# Patient Record
Sex: Male | Born: 1950
Health system: Southern US, Community
[De-identification: ages and names within clinical notes are randomized; demographics above are authoritative.]

## PROBLEM LIST (undated history)

## (undated) DIAGNOSIS — I2510 Atherosclerotic heart disease of native coronary artery without angina pectoris: Secondary | ICD-10-CM

## (undated) DIAGNOSIS — I10 Essential (primary) hypertension: Secondary | ICD-10-CM

## (undated) DIAGNOSIS — K900 Celiac disease: Secondary | ICD-10-CM

## (undated) HISTORY — DX: Atherosclerotic heart disease of native coronary artery without angina pectoris: I25.10

## (undated) HISTORY — DX: Essential (primary) hypertension: I10

## (undated) HISTORY — DX: Celiac disease: K90.0

---

## 2007-03-09 ENCOUNTER — Encounter: Admission: RE | Admit: 2007-03-09 | Discharge: 2007-03-09 | Payer: Self-pay | Admitting: Gastroenterology

## 2007-03-16 ENCOUNTER — Ambulatory Visit (HOSPITAL_COMMUNITY): Admission: RE | Admit: 2007-03-16 | Discharge: 2007-03-16 | Payer: Self-pay | Admitting: Gastroenterology

## 2011-03-22 NOTE — Op Note (Signed)
[name], [name]                 ACCOUNT NO.:  [id number]   MEDICAL RECORD NO.:  [id number]          PATIENT TYPE:  AMB   LOCATION:  ENDO                         FACILITY:  [facility]   PHYSICIAN:  James L. Rolla Flatten., M.D.DATE OF BIRTH:  05/05/1951   DATE OF PROCEDURE:  03/16/2007  DATE OF DISCHARGE:                               OPERATIVE REPORT   PROCEDURE:  Colonoscopy.   MEDICATIONS:  Fentanyl 100 mcg, Versed 10 mg IV.   INSTRUMENT USED:  Pentax pediatric scope.   INDICATIONS:  Colon cancer screening.   DESCRIPTION OF PROCEDURE:  The procedure had been explained to the  patient and consent obtained.  In the left lateral decubitus position,  the Pentax pediatric scope was inserted following a digital exam in  advance.  The prep was excellent.  We were able to reach the cecum. The  ileocecal valve and appendiceal orifice were seen.  The scope was  withdrawn in the cecum.  The ascending, transverse, descending and  sigmoid colon were seen well.  No polyps seen.  No diverticular disease.  The rectum was examined in forward and retroflex view and was normal.  The scope was withdrawn.  The patient tolerated the procedure well.   ASSESSMENT:  Normal screening colonoscopy.   PLAN:  Routine followup.  Recheck stool Hemoccults in 5 years and repeat  colonoscopy in 10 years.           ______________________________  Joyice Faster Rolla Flatten., M.D.     [name]  D:  03/16/2007  T:  03/16/2007  Job:  [id number]   cc:   Modena Jansky. Marisue Humble, M.D.

## 2014-04-16 ENCOUNTER — Other Ambulatory Visit: Payer: Self-pay | Admitting: Gastroenterology

## 2014-04-16 DIAGNOSIS — R109 Unspecified abdominal pain: Secondary | ICD-10-CM

## 2014-04-25 ENCOUNTER — Ambulatory Visit
Admission: RE | Admit: 2014-04-25 | Discharge: 2014-04-25 | Disposition: A | Discharge status: Home / Self Care | Payer: 59 | Source: Ambulatory Visit | Attending: Gastroenterology | Admitting: Gastroenterology

## 2014-04-25 DIAGNOSIS — R109 Unspecified abdominal pain: Secondary | ICD-10-CM

## 2014-04-25 IMAGING — CT CT ENTEROGRAPHY (ABD-PELV W/ CM)
2 of 6 series · 11 of 36 positions shown, 18 images · IV contrast ([ID] VOLUMEN & [ID] OMNI 300)
Comparison: None.

CLINICAL DATA: Low abdominal pain, heme-positive stool. 6 pound
weight loss.

EXAM:
CT ABDOMEN AND PELVIS WITH CONTRAST (ENTEROGRAPHY)
TECHNIQUE: Multidetector CT of the abdomen and pelvis during bolus
administration of intravenous contrast. Negative oral contrast
VoLumen was given.
CONTRAST:  100mL OMNIPAQUE IOHEXOL 300 MG/ML  SOLN

[Series 3: enterography (id) · axial · 0.74mm/px · z∈[-365,+10]mm · 10 of 184 slices shown, 16 images]
[im 17/184  soft-tissue]
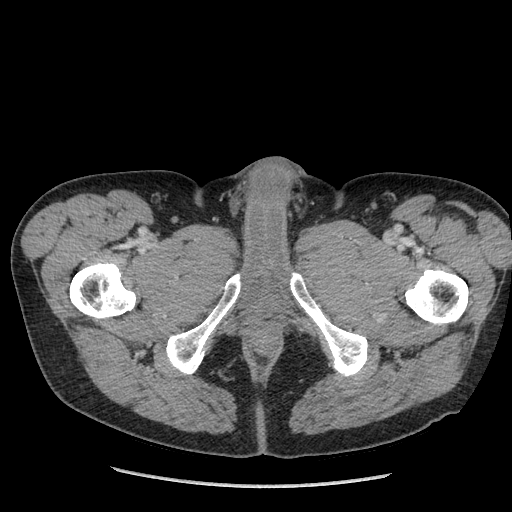
[im 17/184  bone]
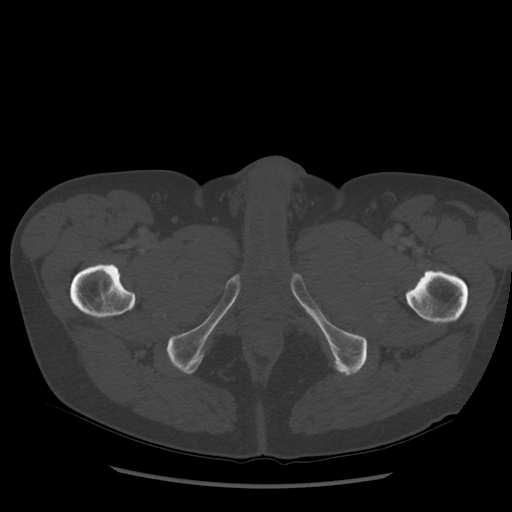
[im 34/184  soft-tissue]
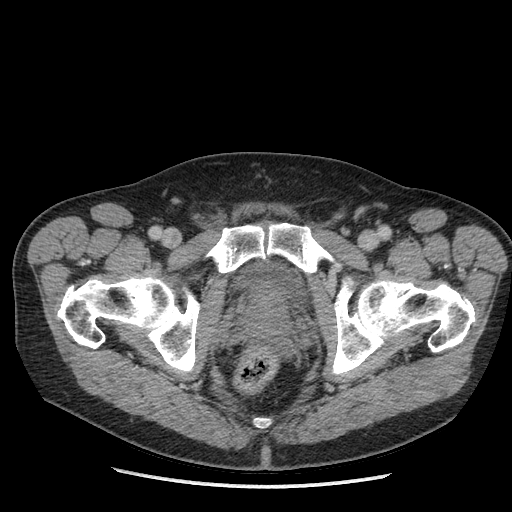
[im 50/184  soft-tissue]
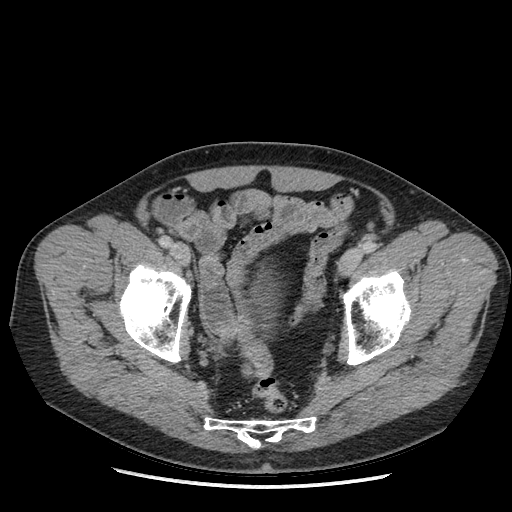
[im 67/184  soft-tissue]
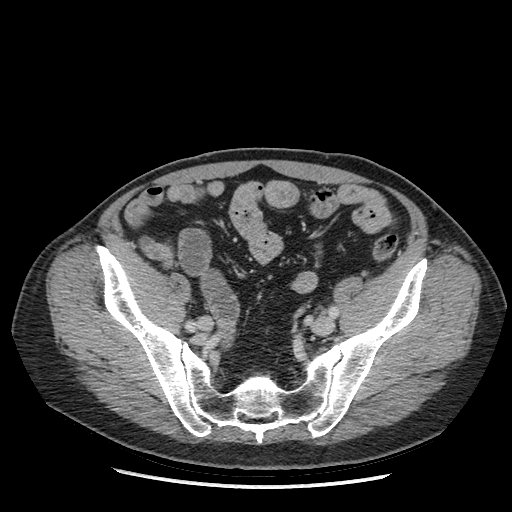
[im 84/184  soft-tissue]
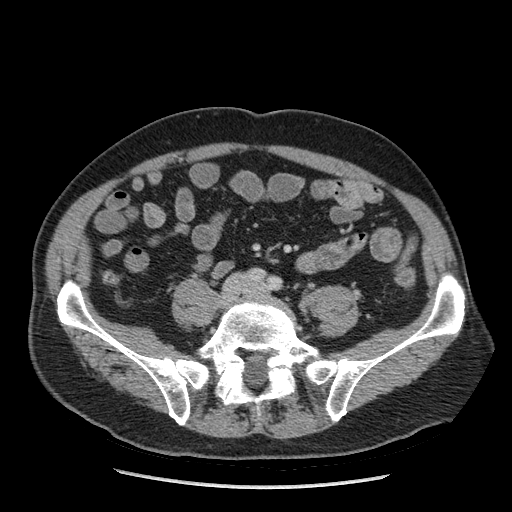
[im 100/184  soft-tissue]
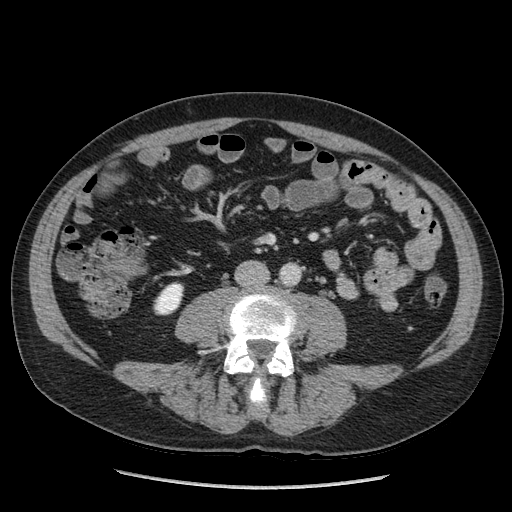
[im 117/184  soft-tissue]
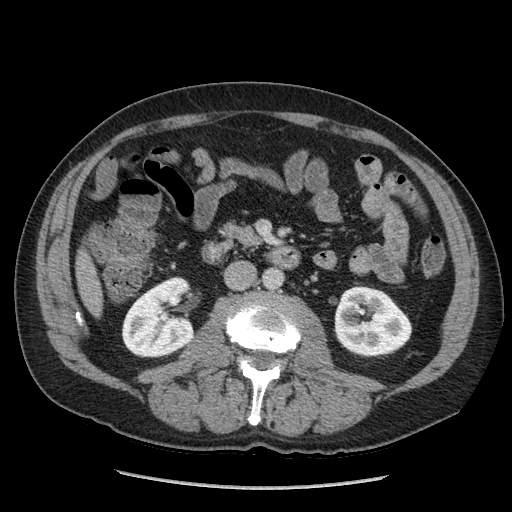
[im 117/184  lung]
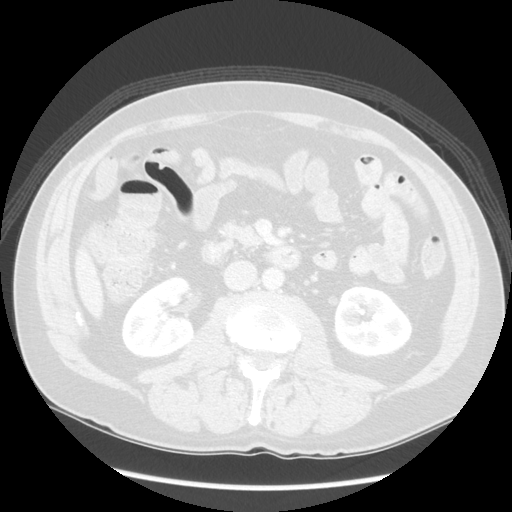
[im 134/184  soft-tissue]
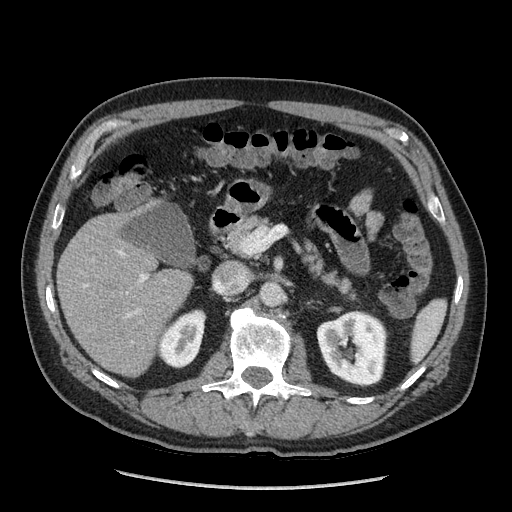
[im 134/184  lung]
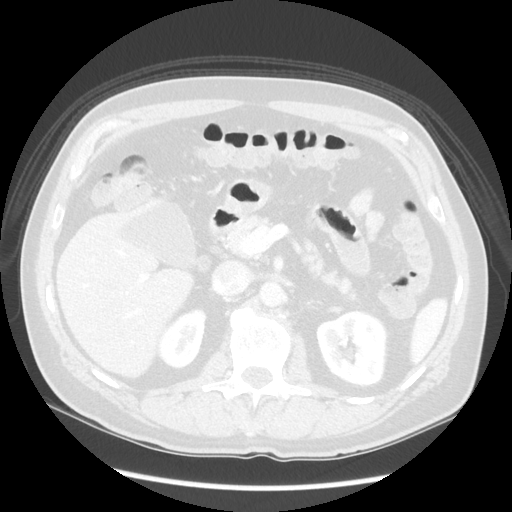
[im 150/184  soft-tissue]
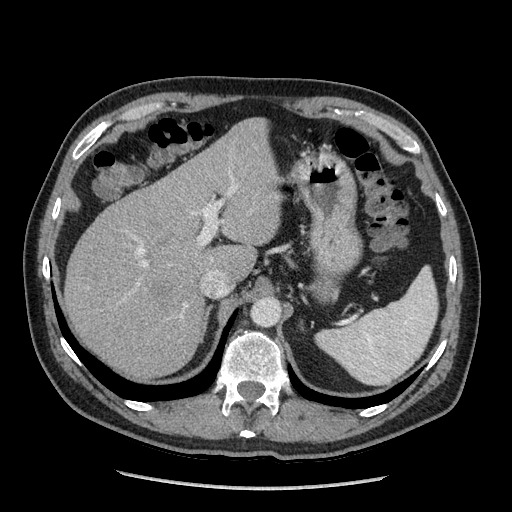
[im 150/184  lung]
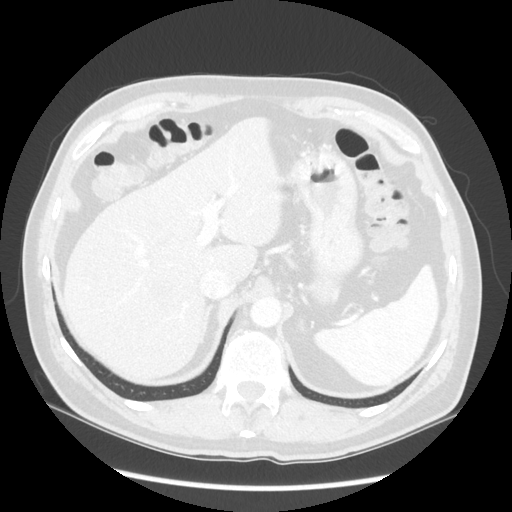
[im 150/184  bone]
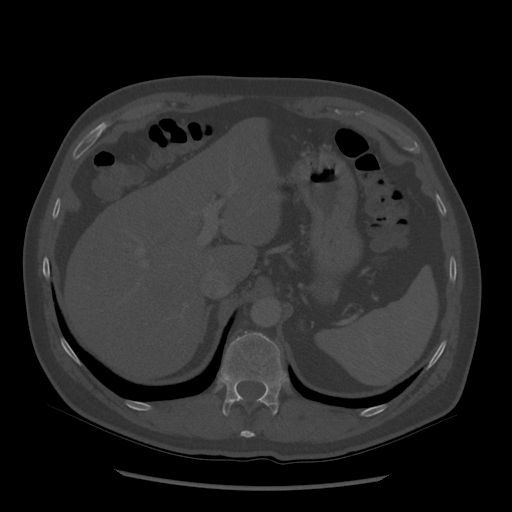
[im 167/184  soft-tissue]
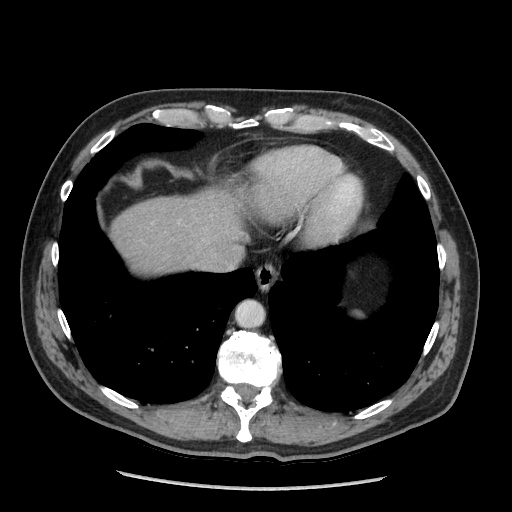
[im 167/184  lung]
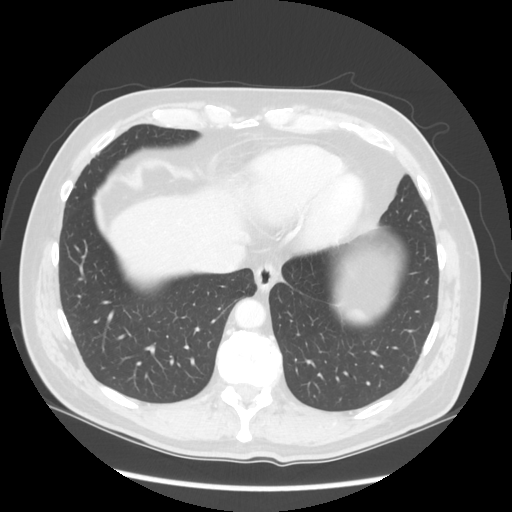

[Series 601: coronal body · coronal · 0.89mm/px · 1 of 121 slices shown, 2 images]
[im 41/121  soft-tissue]
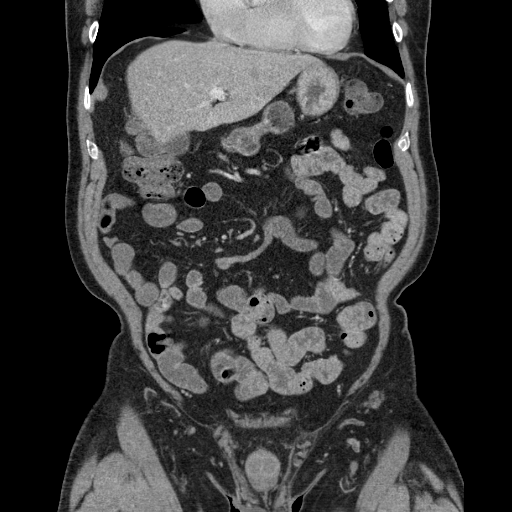
[im 41/121  bone]
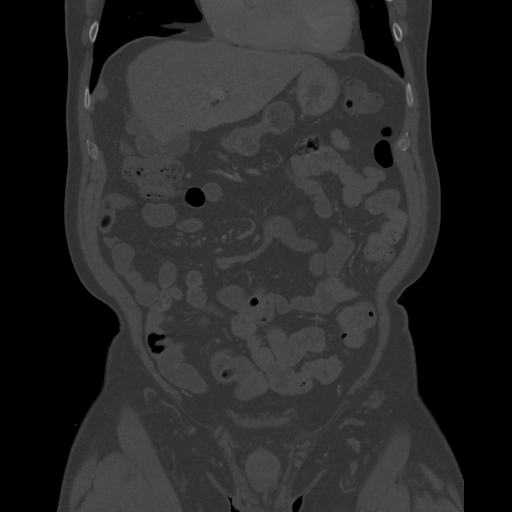

[11 of 36 positions shown; findings below may reference images not displayed]

FINDINGS: Lung bases are clear.  No pleural effusion.

Liver, gallbladder, adrenal glands, right kidney, spleen, and
pancreas are unremarkable. Mild atheromatous aortic calcification
without aneurysm. Small retroperitoneal nodes are noted measuring
less than 5 mm. No ascites. Within the left lower renal pole is a
fluid density 1.5 cm cortical lesion image 71 series 3, with
suggestion of possible thin linear internal septation or streak
artifact image 72 series 3. This is not definitely reproduced on
portal venous phase imaging, image 36 series 4.

No bowel wall thickening or focal segmental dilatation. No mural
hyper enhancement or surrounding stranding. No evidence for intra
enteric fistula. A few colonic diverticuli are incidentally noted
without evidence for diverticulitis. Normal decompressed appendix,
image 51 series 4.

No acute osseous abnormality. Bilateral L5 pars interarticularis
defects incidentally noted. Lumbar spine disc degenerative change
noted with mild leftward curvature centered at L2. Small fat
containing umbilical hernia. Approximately 30% compression deformity
of the superior endplate of L1 is identified with osteosclerosis
suggesting chronicity and no evidence for bony retropulsion.
IMPRESSION: No CT evidence for inflammatory bowel disease or other abnormality
of the hollow viscus to explain the provided history. If the patient
has not had screening colonoscopy, then standard optical colonoscopy
or virtual colonoscopy would be recommended as part of routine
surveillance and evaluation for heme-positive stools, as malignancy
could be occult at standard technique CT.

Probable left lower renal pole cortical cyst. However, on one image
series, there is a suggestion of a possible thin internal septation
and further evaluation with renal ultrasound is recommended for
better depiction of internal architecture, as this could be
artifactual at CT. This may be performed on a nonemergent outpatient
basis.

## 2014-04-25 MED ORDER — IOHEXOL 300 MG/ML  SOLN
100.0000 mL | Freq: Once | INTRAMUSCULAR | Status: AC | PRN
  Administered 2014-04-25: 100 mL via INTRAVENOUS

## 2014-07-04 ENCOUNTER — Encounter: Payer: 59 | Attending: Gastroenterology | Admitting: Dietician

## 2014-07-04 ENCOUNTER — Encounter: Payer: Self-pay | Admitting: Dietician

## 2014-07-04 VITALS — Ht 70.5 in | Wt 187.4 lb

## 2014-07-04 DIAGNOSIS — Z713 Dietary counseling and surveillance: Secondary | ICD-10-CM | POA: Diagnosis present

## 2014-07-04 DIAGNOSIS — K900 Celiac disease: Secondary | ICD-10-CM | POA: Diagnosis present

## 2014-07-04 NOTE — Progress Notes (Signed)
  Medical Nutrition Therapy:  Appt start time: 0800 end time:  0845.   Assessment:  Primary concerns today: [name] is here today with his wife for celiac disease.  He was diagnosed in June 2015 (two months ago). He has been following the gluten-free diet, finding gluten-free products appropriate for his food preferences and finding restaurant options.  He misses bread for sandwiches, hotdogs, and burgers because he does not like the gluten-free versions.  He has been eating gluten-free cereal and gluten-free pasta and snacking on popcorn as his main grain sources.  He lives with his wife and she does the food shopping and cooking.  She will still sometimes cook gluten containing foods for herself but reports washing dishes well between use and cooking his foods separately from hers when needed.  Patient states he experienced 5-10 lb weight loss before diagnosis but his weight has now stabilized.  Learning Readiness:  Change in progress  MEDICATIONS: see list  Progress Towards Goal(s):  In progress.   Nutritional Diagnosis:  NB-1.1 Food and nutrition-related knowledge deficit As related to no prior education on gluten-free diet.  As evidenced by new diagnosis of celiac disease.    Intervention:  Nutrition education on the gluten-free diet.  Described celiac disease.  Described autoimmune reaction to gluten.  Described what foods contain gluten.  Stated the importance of avoiding foods that have come into contact with gluten.  Outlined importance of using separate dishes and cooking utensils at home for gluten and non-gluten containing foods.  Described what ingredients to look for in the ingredient list that identify gluten.  Recommended appropriate grain products that naturally do not contain gluten to provide balanced diet, vitamins, and minerals.  Suggested specialty stores that carry wider range of gluten-free products.  Handouts given during visit include:  NCM Celiac Disease Nutrition  Therapy  NCM Celiac Label Reading Tips  NCM Celiac Nutrition Tips  Barriers to learning/adherence to lifestyle change: none  Demonstrated degree of understanding via:  Teach Back   Monitoring/Evaluation:  Dietary intake and body weight prn.

## 2014-08-13 DIAGNOSIS — Z23 Encounter for immunization: Secondary | ICD-10-CM | POA: Diagnosis not present

## 2014-10-20 DIAGNOSIS — I10 Essential (primary) hypertension: Secondary | ICD-10-CM | POA: Diagnosis not present

## 2014-10-20 DIAGNOSIS — K900 Celiac disease: Secondary | ICD-10-CM | POA: Diagnosis not present

## 2014-10-20 DIAGNOSIS — Z23 Encounter for immunization: Secondary | ICD-10-CM | POA: Diagnosis not present

## 2014-10-20 DIAGNOSIS — K219 Gastro-esophageal reflux disease without esophagitis: Secondary | ICD-10-CM | POA: Diagnosis not present

## 2014-10-20 DIAGNOSIS — G43909 Migraine, unspecified, not intractable, without status migrainosus: Secondary | ICD-10-CM | POA: Diagnosis not present

## 2014-10-20 DIAGNOSIS — R7301 Impaired fasting glucose: Secondary | ICD-10-CM | POA: Diagnosis not present

## 2014-11-13 DIAGNOSIS — R195 Other fecal abnormalities: Secondary | ICD-10-CM | POA: Diagnosis not present

## 2014-11-13 DIAGNOSIS — K900 Celiac disease: Secondary | ICD-10-CM | POA: Diagnosis not present

## 2014-11-19 DIAGNOSIS — L821 Other seborrheic keratosis: Secondary | ICD-10-CM | POA: Diagnosis not present

## 2014-11-19 DIAGNOSIS — Z85828 Personal history of other malignant neoplasm of skin: Secondary | ICD-10-CM | POA: Diagnosis not present

## 2014-11-25 DIAGNOSIS — K900 Celiac disease: Secondary | ICD-10-CM | POA: Diagnosis not present

## 2015-04-23 DIAGNOSIS — K900 Celiac disease: Secondary | ICD-10-CM | POA: Diagnosis not present

## 2015-04-23 DIAGNOSIS — Z0000 Encounter for general adult medical examination without abnormal findings: Secondary | ICD-10-CM | POA: Diagnosis not present

## 2015-04-23 DIAGNOSIS — K219 Gastro-esophageal reflux disease without esophagitis: Secondary | ICD-10-CM | POA: Diagnosis not present

## 2015-04-23 DIAGNOSIS — G43909 Migraine, unspecified, not intractable, without status migrainosus: Secondary | ICD-10-CM | POA: Diagnosis not present

## 2015-04-23 DIAGNOSIS — E785 Hyperlipidemia, unspecified: Secondary | ICD-10-CM | POA: Diagnosis not present

## 2015-04-23 DIAGNOSIS — Z125 Encounter for screening for malignant neoplasm of prostate: Secondary | ICD-10-CM | POA: Diagnosis not present

## 2015-04-23 DIAGNOSIS — I10 Essential (primary) hypertension: Secondary | ICD-10-CM | POA: Diagnosis not present

## 2015-08-17 DIAGNOSIS — Z23 Encounter for immunization: Secondary | ICD-10-CM | POA: Diagnosis not present

## 2015-10-26 DIAGNOSIS — K6289 Other specified diseases of anus and rectum: Secondary | ICD-10-CM | POA: Diagnosis not present

## 2015-10-26 DIAGNOSIS — K219 Gastro-esophageal reflux disease without esophagitis: Secondary | ICD-10-CM | POA: Diagnosis not present

## 2015-10-26 DIAGNOSIS — E785 Hyperlipidemia, unspecified: Secondary | ICD-10-CM | POA: Diagnosis not present

## 2015-10-26 DIAGNOSIS — G43909 Migraine, unspecified, not intractable, without status migrainosus: Secondary | ICD-10-CM | POA: Diagnosis not present

## 2015-10-26 DIAGNOSIS — K900 Celiac disease: Secondary | ICD-10-CM | POA: Diagnosis not present

## 2015-10-26 DIAGNOSIS — Z23 Encounter for immunization: Secondary | ICD-10-CM | POA: Diagnosis not present

## 2015-10-26 DIAGNOSIS — I10 Essential (primary) hypertension: Secondary | ICD-10-CM | POA: Diagnosis not present

## 2016-04-28 DIAGNOSIS — G43909 Migraine, unspecified, not intractable, without status migrainosus: Secondary | ICD-10-CM | POA: Diagnosis not present

## 2016-04-28 DIAGNOSIS — K900 Celiac disease: Secondary | ICD-10-CM | POA: Diagnosis not present

## 2016-04-28 DIAGNOSIS — I10 Essential (primary) hypertension: Secondary | ICD-10-CM | POA: Diagnosis not present

## 2016-04-28 DIAGNOSIS — E785 Hyperlipidemia, unspecified: Secondary | ICD-10-CM | POA: Diagnosis not present

## 2016-04-28 DIAGNOSIS — Z0000 Encounter for general adult medical examination without abnormal findings: Secondary | ICD-10-CM | POA: Diagnosis not present

## 2016-04-28 DIAGNOSIS — K219 Gastro-esophageal reflux disease without esophagitis: Secondary | ICD-10-CM | POA: Diagnosis not present

## 2016-07-01 ENCOUNTER — Emergency Department (HOSPITAL_COMMUNITY): Payer: PPO

## 2016-07-01 ENCOUNTER — Encounter (HOSPITAL_COMMUNITY): Payer: Self-pay

## 2016-07-01 ENCOUNTER — Encounter (HOSPITAL_COMMUNITY)
Admission: EM | Disposition: A | Discharge status: Home / Self Care | Payer: Self-pay | Source: Home / Self Care | Attending: Cardiology

## 2016-07-01 ENCOUNTER — Inpatient Hospital Stay (HOSPITAL_COMMUNITY)
Admission: EM | Admit: 2016-07-01 | Discharge: 2016-07-02 | DRG: 287 | Disposition: A | Discharge status: Home / Self Care | Payer: PPO | Attending: Cardiology | Admitting: Cardiology

## 2016-07-01 DIAGNOSIS — K900 Celiac disease: Secondary | ICD-10-CM | POA: Diagnosis present

## 2016-07-01 DIAGNOSIS — I200 Unstable angina: Secondary | ICD-10-CM | POA: Diagnosis present

## 2016-07-01 DIAGNOSIS — Z7982 Long term (current) use of aspirin: Secondary | ICD-10-CM

## 2016-07-01 DIAGNOSIS — R079 Chest pain, unspecified: Secondary | ICD-10-CM

## 2016-07-01 DIAGNOSIS — R0789 Other chest pain: Secondary | ICD-10-CM | POA: Diagnosis not present

## 2016-07-01 DIAGNOSIS — E785 Hyperlipidemia, unspecified: Secondary | ICD-10-CM | POA: Diagnosis not present

## 2016-07-01 DIAGNOSIS — R0602 Shortness of breath: Secondary | ICD-10-CM | POA: Diagnosis not present

## 2016-07-01 DIAGNOSIS — K5090 Crohn's disease, unspecified, without complications: Secondary | ICD-10-CM | POA: Diagnosis present

## 2016-07-01 DIAGNOSIS — I10 Essential (primary) hypertension: Secondary | ICD-10-CM | POA: Diagnosis present

## 2016-07-01 DIAGNOSIS — I25110 Atherosclerotic heart disease of native coronary artery with unstable angina pectoris: Principal | ICD-10-CM | POA: Diagnosis present

## 2016-07-01 HISTORY — PX: CARDIAC CATHETERIZATION: SHX172

## 2016-07-01 LAB — LIPID PANEL
Cholesterol: 166 mg/dL (ref 0–200)
HDL: 36 mg/dL — ABNORMAL LOW (ref >40)
LDL CALC: 125 mg/dL — AB (ref 0–99)
Total CHOL/HDL Ratio: 4.6 RATIO
Triglycerides: 23 mg/dL (ref <150)
VLDL: 5 mg/dL (ref 0–40)

## 2016-07-01 LAB — CBC
HEMATOCRIT: 42.2 % (ref 39.0–52.0)
HEMOGLOBIN: 14.7 g/dL (ref 13.0–17.0)
MCH: 33 pg (ref 26.0–34.0)
MCHC: 34.8 g/dL (ref 30.0–36.0)
MCV: 94.8 fL (ref 78.0–100.0)
Platelets: 182 10*3/uL (ref 150–400)
RBC: 4.45 MIL/uL (ref 4.22–5.81)
RDW: 12.3 % (ref 11.5–15.5)
WBC: 6.7 10*3/uL (ref 4.0–10.5)

## 2016-07-01 LAB — TROPONIN I
TROPONIN I: 0.11 ng/mL — AB (ref <0.03)
Troponin I: 0.12 ng/mL (ref <0.03)

## 2016-07-01 LAB — BASIC METABOLIC PANEL
ANION GAP: 8 (ref 5–15)
BUN: 14 mg/dL (ref 6–20)
CHLORIDE: 102 mmol/L (ref 101–111)
CO2: 27 mmol/L (ref 22–32)
Calcium: 9.4 mg/dL (ref 8.9–10.3)
Creatinine, Ser: 1.15 mg/dL (ref 0.61–1.24)
Glucose, Bld: 142 mg/dL — ABNORMAL HIGH (ref 65–99)
POTASSIUM: 4.3 mmol/L (ref 3.5–5.1)
SODIUM: 137 mmol/L (ref 135–145)

## 2016-07-01 LAB — I-STAT TROPONIN, ED: Troponin i, poc: 0 ng/mL (ref 0.00–0.08)

## 2016-07-01 LAB — POCT ACTIVATED CLOTTING TIME: ACTIVATED CLOTTING TIME: 197 s

## 2016-07-01 SURGERY — LEFT HEART CATH AND CORONARY ANGIOGRAPHY

## 2016-07-01 MED ORDER — ASPIRIN EC 81 MG PO TBEC
81.0000 mg | DELAYED_RELEASE_TABLET | Freq: Every day | ORAL | Status: DC
  Administered 2016-07-02: 81 mg via ORAL
  Filled 2016-07-01: qty 1

## 2016-07-01 MED ORDER — MIDAZOLAM HCL 2 MG/2ML IJ SOLN
INTRAMUSCULAR | Status: AC
  Filled 2016-07-01: qty 2

## 2016-07-01 MED ORDER — HEPARIN (PORCINE) IN NACL 2-0.9 UNIT/ML-% IJ SOLN
INTRAMUSCULAR | Status: AC
  Filled 2016-07-01: qty 1000

## 2016-07-01 MED ORDER — NITROGLYCERIN 0.4 MG SL SUBL
0.4000 mg | SUBLINGUAL_TABLET | SUBLINGUAL | Status: DC | PRN
  Administered 2016-07-01 (×2): 0.4 mg via SUBLINGUAL
  Filled 2016-07-01: qty 1

## 2016-07-01 MED ORDER — CARVEDILOL 6.25 MG PO TABS
6.2500 mg | ORAL_TABLET | Freq: Two times a day (BID) | ORAL | Status: DC
Start: 2016-07-01 — End: 2016-07-02
  Administered 2016-07-01 – 2016-07-02 (×2): 6.25 mg via ORAL
  Filled 2016-07-01 (×2): qty 1

## 2016-07-01 MED ORDER — ACETAMINOPHEN 325 MG PO TABS: 650.0000 mg | ORAL_TABLET | ORAL | Status: DC | PRN

## 2016-07-01 MED ORDER — SODIUM CHLORIDE 0.9% FLUSH: 3.0000 mL | Freq: Two times a day (BID) | INTRAVENOUS | Status: DC

## 2016-07-01 MED ORDER — IOPAMIDOL (ISOVUE-370) INJECTION 76%
INTRAVENOUS | Status: AC
  Filled 2016-07-01: qty 100

## 2016-07-01 MED ORDER — SODIUM CHLORIDE 0.9% FLUSH: 3.0000 mL | INTRAVENOUS | Status: DC | PRN

## 2016-07-01 MED ORDER — HEPARIN SODIUM (PORCINE) 1000 UNIT/ML IJ SOLN
INTRAMUSCULAR | Status: AC
  Filled 2016-07-01: qty 1

## 2016-07-01 MED ORDER — ASPIRIN 81 MG PO TABS: 81.0000 mg | ORAL_TABLET | Freq: Every day | ORAL | Status: DC

## 2016-07-01 MED ORDER — HYDROMORPHONE HCL 1 MG/ML IJ SOLN
INTRAMUSCULAR | Status: DC | PRN
  Administered 2016-07-01 (×2): 0.5 mg via INTRAVENOUS

## 2016-07-01 MED ORDER — LIDOCAINE HCL (PF) 1 % IJ SOLN
INTRAMUSCULAR | Status: AC
  Filled 2016-07-01: qty 30

## 2016-07-01 MED ORDER — ATORVASTATIN CALCIUM 80 MG PO TABS: 80.0000 mg | ORAL_TABLET | Freq: Every day | ORAL | Status: DC

## 2016-07-01 MED ORDER — HEPARIN (PORCINE) IN NACL 2-0.9 UNIT/ML-% IJ SOLN
INTRAMUSCULAR | Status: DC | PRN
  Administered 2016-07-01: 1000 mL

## 2016-07-01 MED ORDER — VERAPAMIL HCL 2.5 MG/ML IV SOLN
INTRA_ARTERIAL | Status: DC | PRN
  Administered 2016-07-01 (×2): 10 mL via INTRA_ARTERIAL

## 2016-07-01 MED ORDER — SUMATRIPTAN SUCCINATE 100 MG PO TABS
100.0000 mg | ORAL_TABLET | ORAL | Status: DC | PRN
  Filled 2016-07-01: qty 1

## 2016-07-01 MED ORDER — CARVEDILOL 3.125 MG PO TABS: 3.1250 mg | ORAL_TABLET | Freq: Two times a day (BID) | ORAL | Status: DC

## 2016-07-01 MED ORDER — HEPARIN SODIUM (PORCINE) 1000 UNIT/ML IJ SOLN
INTRAMUSCULAR | Status: DC | PRN
  Administered 2016-07-01: 6000 [IU] via INTRAVENOUS
  Administered 2016-07-01: 3000 [IU] via INTRAVENOUS

## 2016-07-01 MED ORDER — ATORVASTATIN CALCIUM 80 MG PO TABS
80.0000 mg | ORAL_TABLET | Freq: Every day | ORAL | Status: DC
  Administered 2016-07-01: 80 mg via ORAL

## 2016-07-01 MED ORDER — ASPIRIN 81 MG PO CHEW
324.0000 mg | CHEWABLE_TABLET | Freq: Once | ORAL | Status: AC
  Administered 2016-07-01: 324 mg via ORAL
  Filled 2016-07-01: qty 4

## 2016-07-01 MED ORDER — LIDOCAINE HCL (PF) 1 % IJ SOLN
INTRAMUSCULAR | Status: DC | PRN
  Administered 2016-07-01: 2 mL via INTRADERMAL

## 2016-07-01 MED ORDER — IOPAMIDOL (ISOVUE-370) INJECTION 76%
INTRAVENOUS | Status: DC | PRN
  Administered 2016-07-01: 60 mL via INTRA_ARTERIAL

## 2016-07-01 MED ORDER — PANTOPRAZOLE SODIUM 40 MG PO TBEC: 40.0000 mg | DELAYED_RELEASE_TABLET | Freq: Every day | ORAL | Status: DC

## 2016-07-01 MED ORDER — HYDROMORPHONE HCL 1 MG/ML IJ SOLN
INTRAMUSCULAR | Status: AC
  Filled 2016-07-01: qty 1

## 2016-07-01 MED ORDER — PANTOPRAZOLE SODIUM 40 MG PO TBEC
40.0000 mg | DELAYED_RELEASE_TABLET | Freq: Every day | ORAL | Status: DC
  Administered 2016-07-02: 40 mg via ORAL
  Filled 2016-07-01: qty 1

## 2016-07-01 MED ORDER — CARVEDILOL 6.25 MG PO TABS: 6.2500 mg | ORAL_TABLET | Freq: Two times a day (BID) | ORAL | Status: DC

## 2016-07-01 MED ORDER — VERAPAMIL HCL 2.5 MG/ML IV SOLN
INTRAVENOUS | Status: AC
  Filled 2016-07-01: qty 2

## 2016-07-01 MED ORDER — MIDAZOLAM HCL 2 MG/2ML IJ SOLN
INTRAMUSCULAR | Status: DC | PRN
  Administered 2016-07-01: 1 mg via INTRAVENOUS
  Administered 2016-07-01: 2 mg via INTRAVENOUS

## 2016-07-01 MED ORDER — LISINOPRIL 10 MG PO TABS
10.0000 mg | ORAL_TABLET | Freq: Every day | ORAL | Status: DC
  Administered 2016-07-02: 10 mg via ORAL
  Filled 2016-07-01: qty 1

## 2016-07-01 MED ORDER — ONDANSETRON HCL 4 MG/2ML IJ SOLN: 4.0000 mg | Freq: Four times a day (QID) | INTRAMUSCULAR | Status: DC | PRN

## 2016-07-01 MED ORDER — SODIUM CHLORIDE 0.9 % WEIGHT BASED INFUSION: 1.0000 mL/kg/h | INTRAVENOUS | Status: DC

## 2016-07-01 MED ORDER — SODIUM CHLORIDE 0.9 % WEIGHT BASED INFUSION: 3.0000 mL/kg/h | INTRAVENOUS | Status: DC

## 2016-07-01 MED ORDER — ATORVASTATIN CALCIUM 80 MG PO TABS
80.0000 mg | ORAL_TABLET | Freq: Every day | ORAL | Status: DC
  Filled 2016-07-01: qty 1

## 2016-07-01 MED ORDER — NITROGLYCERIN 1 MG/10 ML FOR IR/CATH LAB
INTRA_ARTERIAL | Status: AC
  Filled 2016-07-01: qty 10

## 2016-07-01 MED ORDER — SODIUM CHLORIDE 0.9 % IV SOLN: 250.0000 mL | INTRAVENOUS | Status: DC | PRN

## 2016-07-01 SURGICAL SUPPLY — 12 items
CATH IMPULSE 5F ANG/FL3.5 (CATHETERS) ×3 IMPLANT
CATH OPTITORQUE TIG 4.0 5F (CATHETERS) ×3 IMPLANT
CATH VISTA GUIDE 6FR AR1 (CATHETERS) ×3 IMPLANT
DEVICE RAD COMP TR BAND LRG (VASCULAR PRODUCTS) ×3 IMPLANT
GLIDESHEATH SLEND A-KIT 6F 20G (SHEATH) ×3 IMPLANT
KIT ENCORE 26 ADVANTAGE (KITS) ×3 IMPLANT
KIT HEART LEFT (KITS) ×3 IMPLANT
PACK CARDIAC CATHETERIZATION (CUSTOM PROCEDURE TRAY) ×3 IMPLANT
TRANSDUCER W/STOPCOCK (MISCELLANEOUS) ×3 IMPLANT
TUBING CIL FLEX 10 FLL-RA (TUBING) ×3 IMPLANT
WIRE COUGAR XT STRL 190CM (WIRE) ×3 IMPLANT
WIRE SAFE-T 1.5MM-J .035X260CM (WIRE) ×3 IMPLANT

## 2016-07-01 NOTE — Progress Notes (Signed)
Notified by CCMD of new-onset Mobitz I (Wenkebach) AV block.  Patient is asymptomatic at this time. Will obtain 12-lead EKG to confirm and notify MD for any symptoms overnight.  Continuing to monitor.

## 2016-07-01 NOTE — ED Notes (Signed)
Chest pain 2-3/10.

## 2016-07-01 NOTE — H&P (Signed)
[name] is an 65 y.o. male.   Chief Complaint: Chest pain HPI: [name]  is a 65 y.o. male with a history of HTN and Chron's. He developed midsternal chest pain on the evening of 06/30/2016 while sitting on the couch. Pain persisted throughout the night and was still present this morning prompting presentation the ED. He was given Sl Ntg and ASA and pain has since resolved. He also reports a similar episode several days, also at rest, however resolved spontaneously. He states he has been taking care of his parents' house and has been doing a lot of yard this week. Symptoms seemed to improve when he would get up and walk around last night. No SOB, PND, orthopnea, edema, or symptoms suggestive of TIA or claudication. No history of DM, HLD, thyroid disease, tobacco use, or family history of premature CAD.  Past Medical History:  Diagnosis Date  . Celiac disease   . Hypertension     History reviewed. No pertinent surgical history.  No family history on file. Social History:  reports that he has never smoked. He has never used smokeless tobacco. He reports that he drinks alcohol. He reports that he does not use drugs.  Allergies:  Allergies  Allergen Reactions  . Advil [Ibuprofen] Other (See Comments)    Unknown    Review of Systems - History obtained from the patient General ROS: negative for - fatigue, fever, malaise, weight gain or weight loss Respiratory ROS: negative for - cough, orthopnea or shortness of breath Cardiovascular ROS: positive for - chest pain negative for - dyspnea on exertion, edema, palpitations or paroxysmal nocturnal dyspnea Gastrointestinal ROS: no abdominal pain, change in bowel habits, or black or bloody stools Musculoskeletal ROS: negative for - joint pain, joint swelling or muscle pain Neurological ROS: no TIA or stroke symptoms  Blood pressure 124/71, pulse 77, temperature 98.4 F (36.9 C), temperature source Oral, resp. rate 13, height _0  (1.778 m),  weight 86.2 kg (190 lb), SpO2 98 %.   General appearance: alert, cooperative, appears stated age and no distress Eyes: negative Neck: no adenopathy, no carotid bruit, no JVD, supple, symmetrical, trachea midline and thyroid not enlarged, symmetric, no tenderness/mass/nodules Resp: clear to auscultation bilaterally Chest wall: no tenderness Cardio: S1, S2 normal and I/VI SEM at RUSB and apex GI: soft, non-tender; bowel sounds normal; no masses,  no organomegaly Extremities: extremities normal, atraumatic, no cyanosis or edema Pulses: 2+ and symmetric Skin: Skin color, texture, turgor normal. No rashes or lesions Neurologic: Grossly normal  Results for orders placed or performed during the hospital encounter of 07/01/16 (from the past 48 hour(s))  Basic metabolic panel     Status: Abnormal   Collection Time: 07/01/16 11:13 AM  Result Value Ref Range   Sodium 137 135 - 145 mmol/L   Potassium 4.3 3.5 - 5.1 mmol/L   Chloride 102 101 - 111 mmol/L   CO2 27 22 - 32 mmol/L   Glucose, Bld 142 (H) 65 - 99 mg/dL   BUN 14 6 - 20 mg/dL   Creatinine, Ser 1.15 0.61 - 1.24 mg/dL   Calcium 9.4 8.9 - 10.3 mg/dL   GFR calc non Af Amer >60 >60 mL/min   GFR calc Af Amer >60 >60 mL/min    Comment: (NOTE) The eGFR has been calculated using the CKD EPI equation. This calculation has not been validated in all clinical situations. eGFR's persistently <60 mL/min signify possible Chronic Kidney Disease.    Anion gap 8 5 -  15  CBC     Status: None   Collection Time: 07/01/16 11:13 AM  Result Value Ref Range   WBC 6.7 4.0 - 10.5 K/uL   RBC 4.45 4.22 - 5.81 MIL/uL   Hemoglobin 14.7 13.0 - 17.0 g/dL   HCT 42.2 39.0 - 52.0 %   MCV 94.8 78.0 - 100.0 fL   MCH 33.0 26.0 - 34.0 pg   MCHC 34.8 30.0 - 36.0 g/dL   RDW 12.3 11.5 - 15.5 %   Platelets 182 150 - 400 K/uL  I-stat troponin, ED     Status: None   Collection Time: 07/01/16 11:39 AM  Result Value Ref Range   Troponin i, poc 0.00 0.00 - 0.08 ng/mL    Comment 3            Comment: Due to the release kinetics of cTnI, a negative result within the first hours of the onset of symptoms does not rule out myocardial infarction with certainty. If myocardial infarction is still suspected, repeat the test at appropriate intervals.    Dg Chest 2 View  Result Date: 07/01/2016 CLINICAL DATA:  Intermittent chest tightness and discomfort for 3 days EXAM: CHEST  2 VIEW COMPARISON:  None. FINDINGS: The heart size and mediastinal contours are within normal limits. Both lungs are clear. The visualized skeletal structures are unremarkable. IMPRESSION: No active cardiopulmonary disease. Electronically Signed   By: Kerby Moors M.D.   On: 07/01/2016 12:01    Labs:   Lab Results  Component Value Date   WBC 6.7 07/01/2016   HGB 14.7 07/01/2016   HCT 42.2 07/01/2016   MCV 94.8 07/01/2016   PLT 182 07/01/2016    Recent Labs Lab 07/01/16 1113  NA 137  K 4.3  CL 102  CO2 27  BUN 14  CREATININE 1.15  CALCIUM 9.4  GLUCOSE 142*    Lipid Panel  No results found for: CHOL, TRIG, HDL, CHOLHDL, VLDL, LDLCALC  BNP (last 3 results) No results for input(s): BNP in the last 8760 hours.  HEMOGLOBIN A1C No results found for: HGBA1C, MPG  Cardiac Panel (last 3 results) No results for input(s): CKTOTAL, CKMB, TROPONINI, RELINDX in the last 8760 hours.  No results found for: CKTOTAL, CKMB, CKMBINDEX, TROPONINI   TSH No results for input(s): TSH in the last 8760 hours.  EKG 07/01/2016: sinus rhythm with 1st degree AV block at a rate of 70bpm, normal axis, no evidence of ischemia.   (Not in a hospital admission)    Current Facility-Administered Medications:  .  nitroGLYCERIN (NITROSTAT) SL tablet 0.4 mg, 0.4 mg, Sublingual, Q5 min PRN, Julianne Rice, MD, 0.4 mg at 07/01/16 1306  Current Outpatient Prescriptions:  .  aspirin 81 MG tablet, Take 81 mg by mouth daily., Disp: , Rfl:  .  Fish Oil-Cholecalciferol (FISH OIL + D3 PO), Take 1  capsule by mouth at bedtime. , Disp: , Rfl:  .  lisinopril (PRINIVIL,ZESTRIL) 10 MG tablet, Take 10 mg by mouth daily., Disp: , Rfl:  .  omeprazole (PRILOSEC) 20 MG capsule, Take 20 mg by mouth every other day., Disp: , Rfl:  .  SUMAtriptan (IMITREX) 100 MG tablet, Take 100 mg by mouth every 2 (two) hours as needed for migraine or headache. May repeat in 2 hours if headache persists or recurs., Disp: , Rfl:     Assessment/Plan 1. Atypical chest pain 2. Essential Hypertension 3. Celiac Disease  Recommendation: Symptoms atypical, however, relieved with nitroglycerin. No ischemic changes on EKG  and troponin negative despite ongoing pain for 12+ hours. Would recommend stress testing to exclude ischemic etiology. Will also repeat troponin, if positive, will then need coronary angiogram. Dr. Einar Gip to see with further recommendations.  Rachel Bo, NP-C 07/01/2016, 3:08 PM [facility] Pager: [phone] Office: [phone]

## 2016-07-01 NOTE — ED Notes (Signed)
Pt states chest pain 1/10.

## 2016-07-01 NOTE — ED Provider Notes (Signed)
[facility] DEPT Provider Note   CSN: [id number] Arrival date & time: 07/01/16  1048     History   Chief Complaint Chief Complaint  Patient presents with  . Chest Pain    HPI [name] is a 65 y.o. male.  HPI Patient presents with 3 days of episodic chest tightness. No nausea, vomiting, shortness of breath. No lower extremity swelling or pain. Patient states that he began having tightness yesterday evening. This persisted through the night. No radiation. Rates his pain currently 5/10. No history of coronary artery disease. Father had MI in his 36s. No fever or chills. Patient states he's been under increased stress recently. Past Medical History:  Diagnosis Date  . Celiac disease   . Hypertension     There are no active problems to display for this patient.   History reviewed. No pertinent surgical history.     Home Medications    Prior to Admission medications   Medication Sig Start Date End Date Taking? Authorizing Provider  aspirin 81 MG tablet Take 81 mg by mouth daily.    Historical Provider, MD  Fish Oil-Cholecalciferol (FISH OIL + D3 PO) Take by mouth.    Historical Provider, MD  lisinopril (PRINIVIL,ZESTRIL) 10 MG tablet Take 10 mg by mouth daily.    Historical Provider, MD  Multiple Vitamin (MULTIVITAMIN) tablet Take 1 tablet by mouth daily.    Historical Provider, MD  OMEPRAZOLE PO Take by mouth.    Historical Provider, MD  SUMAtriptan (IMITREX) 100 MG tablet Take 100 mg by mouth every 2 (two) hours as needed for migraine or headache. May repeat in 2 hours if headache persists or recurs.    Historical Provider, MD  vitamin C (ASCORBIC ACID) 500 MG tablet Take 500 mg by mouth daily.    Historical Provider, MD    Family History No family history on file.  Social History Social History  Substance Use Topics  . Smoking status: Never Smoker  . Smokeless tobacco: Never Used  . Alcohol use Yes     Allergies   Advil [ibuprofen]   Review of  Systems Review of Systems  Constitutional: Negative for chills and fever.  Respiratory: Positive for chest tightness and shortness of breath. Negative for cough.   Cardiovascular: Positive for chest pain. Negative for palpitations and leg swelling.  Gastrointestinal: Negative for abdominal pain, constipation, diarrhea, nausea and vomiting.  Musculoskeletal: Negative for back pain, myalgias, neck pain and neck stiffness.  Skin: Negative for rash and wound.  Neurological: Negative for dizziness, weakness, light-headedness, numbness and headaches.  All other systems reviewed and are negative.    Physical Exam Updated Vital Signs BP 155/84   Pulse 77   Temp 98.4 F (36.9 C) (Oral)   Resp 17   Ht 5' 10"  (1.778 m)   Wt 190 lb (86.2 kg)   SpO2 98%   BMI 27.26 kg/m   Physical Exam  Constitutional: He is oriented to person, place, and time. He appears well-developed and well-nourished.  HENT:  Head: Normocephalic and atraumatic.  Mouth/Throat: Oropharynx is clear and moist.  Eyes: EOM are normal. Pupils are equal, round, and reactive to light.  Neck: Normal range of motion. Neck supple. No JVD present.  Cardiovascular: Normal rate and regular rhythm.  Exam reveals no gallop and no friction rub.   Murmur heard. Pulmonary/Chest: Effort normal and breath sounds normal. No respiratory distress. He has no wheezes. He has no rales. He exhibits no tenderness.  Abdominal: Soft. Bowel sounds are  normal. There is no tenderness. There is no rebound and no guarding.  Musculoskeletal: Normal range of motion. He exhibits no edema or tenderness.  No lower extremity swelling, tenderness or asymmetry. Distal pulses are 2+.  Neurological: He is alert and oriented to person, place, and time.  Moves all extremities without deficit. Sensation is fully intact.  Skin: Skin is warm and dry. Capillary refill takes less than 2 seconds. No rash noted. No erythema.  Psychiatric: He has a normal mood and affect.  His behavior is normal.  Nursing note and vitals reviewed.    ED Treatments / Results  Labs (all labs ordered are listed, but only abnormal results are displayed) Labs Reviewed  BASIC METABOLIC PANEL - Abnormal; Notable for the following:       Result Value   Glucose, Bld 142 (*)    All other components within normal limits  CBC  I-STAT TROPOININ, ED    EKG  EKG Interpretation  Date/Time:  Friday July 01 2016 10:53:11 EDT Ventricular Rate:  92 PR Interval:  192 QRS Duration: 72 QT Interval:  340 QTC Calculation: 420 R Axis:   78 Text Interpretation:  Normal sinus rhythm Normal ECG Confirmed by Lita Mains  MD, Matei Magnone ([id number]) on 07/01/2016 12:26:11 PM       Radiology Dg Chest 2 View  Result Date: 07/01/2016 CLINICAL DATA:  Intermittent chest tightness and discomfort for 3 days EXAM: CHEST  2 VIEW COMPARISON:  None. FINDINGS: The heart size and mediastinal contours are within normal limits. Both lungs are clear. The visualized skeletal structures are unremarkable. IMPRESSION: No active cardiopulmonary disease. Electronically Signed   By: Kerby Moors M.D.   On: 07/01/2016 12:01    Procedures Procedures (including critical care time)  Medications Ordered in ED Medications  nitroGLYCERIN (NITROSTAT) SL tablet 0.4 mg (not administered)  aspirin chewable tablet 324 mg (not administered)     Initial Impression / Assessment and Plan / ED Course  I have reviewed the triage vital signs and the nursing notes.  Pertinent labs & imaging results that were available during my care of the patient were reviewed by me and considered in my medical decision making (see chart for details).  Clinical Course  Will give aspirin and nitroglycerin and reassess pain.  Patient is currently chest pain-free. Discussed with Dr. Einar Gip and will evaluate in the emergency department.  Final Clinical Impressions(s) / ED Diagnoses   Final diagnoses:  None    New Prescriptions New  Prescriptions   No medications on file     Julianne Rice, MD 07/01/16 1601

## 2016-07-01 NOTE — ED Triage Notes (Signed)
Per Pt, Pt is coming from home with a complaint of intermittent chest pain for the past three days. Reports slight SOB with exertion. Denies any dizziness, N/V.

## 2016-07-01 NOTE — Interval H&P Note (Signed)
History and Physical Interval Note:  07/01/2016 6:07 PM  [name]  has presented today for surgery, with the diagnosis of cp  The various methods of treatment have been discussed with the patient and family. After consideration of risks, benefits and other options for treatment, the patient has consented to  Procedure(s): Left Heart Cath and Coronary Angiography (N/A) and possible PCI as a surgical intervention .  The patient's history has been reviewed, patient examined, no change in status, stable for surgery.  I have reviewed the patient's chart and labs.  Questions were answered to the patient's satisfaction.   Cath Lab Visit (complete for each Cath Lab visit)  Clinical Evaluation Leading to the Procedure:   ACS: Yes.    Non-ACS:    Anginal Classification: CCS IV  Anti-ischemic medical therapy: No Therapy  Non-Invasive Test Results: No non-invasive testing performed  Prior CABG: No previous CABG        Adrian Prows

## 2016-07-01 NOTE — Plan of Care (Signed)
Problem: Consults Goal: Cardiac Cath Patient Education (See Patient Education module for education specifics.) Outcome: Completed/Met Date Met: 07/01/16 TR band removed and gauze with tegaderm dressing applied to site. No evidence of subcutaneous bleeding or other site problems. No pain reported.  Patient and family educated on post-cath site care, dressing care and removal, activity restrictions, bathroom priveleges until AM, potential site complications, signs and symptoms of infection, and when to call MD. No questions at this time; both patient and spouse verbalized understanding.  Continuing to monitor.  Problem: Phase I Progression Outcomes Goal: Initial discharge plan identified Outcome: Completed/Met Date Met: 07/01/16 Plan medical management per MD (up-titration of carvedilol from 3.125 mg BID to 6.25 mg BID) with likely discharge in AM 8/26. Goal: Post Cath/PCI return to appropriate Path Outcome: Progressing Will return patient to general pathway per protocol.

## 2016-07-02 LAB — TROPONIN I: Troponin I: 0.15 ng/mL (ref <0.03)

## 2016-07-02 MED ORDER — NITROGLYCERIN 0.4 MG SL SUBL: 0.4000 mg | SUBLINGUAL_TABLET | SUBLINGUAL | 1 refills | Status: AC | PRN

## 2016-07-02 MED ORDER — CARVEDILOL 6.25 MG PO TABS: 6.2500 mg | ORAL_TABLET | Freq: Two times a day (BID) | ORAL | 1 refills | Status: DC

## 2016-07-02 MED ORDER — ATORVASTATIN CALCIUM 80 MG PO TABS: 80.0000 mg | ORAL_TABLET | Freq: Every day | ORAL | 1 refills | Status: DC

## 2016-07-02 NOTE — Discharge Summary (Signed)
Physician Discharge Summary  Patient ID: [name] MRN: [id number] DOB/AGE: [birth year] 65 y.o.  Admit date: 07/01/2016 Discharge date: 07/02/2016  Discharge Diagnoses: 1. Mild, non obstructive CAD 2. Hyperlipidemia 3. Essential Hypertension 4. Celiac Disease  Significant Diagnostic Studies: Coronary Angiogram 07/01/2016:  Prox RCA lesion, 20 %stenosed.  Mid RCA to Dist RCA lesion, 20 %stenosed.  Prox LAD to Mid LAD lesion, 30 %stenosed.  Hospital Course: Ann Groeneveld  is a 65 y.o. male with a history of HTN and Chron's. He developed midsternal chest pain on the evening of 06/30/2016 while sitting on the couch. Pain persisted throughout the night and was still present this morning prompting presentation the ED. He was given Sl Ntg and ASA and pain has since resolved. He also reports a similar episode several days, also at rest, however resolved spontaneously. He states he has been taking care of his parents' house and has been doing a lot of yard this week. Symptoms seemed to improve when he would get up and walk around last night. No SOB, PND, orthopnea, edema, or symptoms suggestive of TIA or claudication. No history of DM, HLD, thyroid disease, tobacco use, or family history of premature CAD. Troponin minimally elevated, therefore was scheduled for coronary angiogram which revealed mild coronary artery disease. Medical therapy recommended. Pain free since cath.   Recommendations on discharge: Will start beta blocker and atorvastatin. Discussed aggressive medical therapy and lifestyle modification. Follow up outpatient for reevaluation.   Discharge Exam: Blood pressure 111/63, pulse 72, temperature 98.8 F (37.1 C), temperature source Oral, resp. rate 16, height 5' 10"  (1.778 m), weight 86 kg (189 lb 9.6 oz), SpO2 97 %.    General appearance: alert, cooperative, appears stated age and no distress Eyes: negative Neck: no adenopathy, no carotid bruit, no JVD, supple, symmetrical, trachea  midline and thyroid not enlarged, symmetric, no tenderness/mass/nodules Resp: clear to auscultation bilaterally Chest wall: no tenderness Cardio: S1, S2 normal and I/VI SEM at RUSB and apex GI: soft, non-tender; bowel sounds normal; no masses,  no organomegaly Extremities: extremities normal, atraumatic, no cyanosis or edema Pulses: 2+ and symmetric, right radial access site asymptomatic Skin: Skin color, texture, turgor normal. No rashes or lesions Neurologic: Grossly normal  Labs:   Lab Results  Component Value Date   WBC 6.7 07/01/2016   HGB 14.7 07/01/2016   HCT 42.2 07/01/2016   MCV 94.8 07/01/2016   PLT 182 07/01/2016    Recent Labs Lab 07/01/16 1113  NA 137  K 4.3  CL 102  CO2 27  BUN 14  CREATININE 1.15  CALCIUM 9.4  GLUCOSE 142*    Lipid Panel     Component Value Date/Time   CHOL 166 07/01/2016 1932   TRIG 23 07/01/2016 1932   HDL 36 (L) 07/01/2016 1932   CHOLHDL 4.6 07/01/2016 1932   VLDL 5 07/01/2016 1932   LDLCALC 125 (H) 07/01/2016 1932    BNP (last 3 results) No results for input(s): BNP in the last 8760 hours.  HEMOGLOBIN A1C No results found for: HGBA1C, MPG  Cardiac Panel (last 3 results)  Recent Labs  07/01/16 1540 07/01/16 1932 07/02/16 0620  TROPONINI 0.11* 0.12* 0.15*    Lab Results  Component Value Date   TROPONINI 0.15 ([facility]) 07/02/2016     TSH No results for input(s): TSH in the last 8760 hours.  EKG 07/01/2016: sinus rhythm with 1st degree AV block at a rate of 70bpm, normal axis, no evidence of ischemia.  Radiology: Dg Chest 2  View  Result Date: 07/01/2016 CLINICAL DATA:  Intermittent chest tightness and discomfort for 3 days EXAM: CHEST  2 VIEW COMPARISON:  None. FINDINGS: The heart size and mediastinal contours are within normal limits. Both lungs are clear. The visualized skeletal structures are unremarkable. IMPRESSION: No active cardiopulmonary disease. Electronically Signed   By: Kerby Moors M.D.   On: 07/01/2016  12:01      FOLLOW UP PLANS AND APPOINTMENTS    Medication List    TAKE these medications   aspirin 81 MG tablet Take 81 mg by mouth daily.   atorvastatin 80 MG tablet Commonly known as:  LIPITOR Take 1 tablet (80 mg total) by mouth daily at 6 PM.   carvedilol 6.25 MG tablet Commonly known as:  COREG Take 1 tablet (6.25 mg total) by mouth 2 (two) times daily with a meal.   FISH OIL + D3 PO Take 1 capsule by mouth at bedtime.   lisinopril 10 MG tablet Commonly known as:  PRINIVIL,ZESTRIL Take 10 mg by mouth daily.   nitroGLYCERIN 0.4 MG SL tablet Commonly known as:  NITROSTAT Place 1 tablet (0.4 mg total) under the tongue every 5 (five) minutes as needed for chest pain.   omeprazole 20 MG capsule Commonly known as:  PRILOSEC Take 20 mg by mouth every other day.   SUMAtriptan 100 MG tablet Commonly known as:  IMITREX Take 100 mg by mouth every 2 (two) hours as needed for migraine or headache. May repeat in 2 hours if headache persists or recurs.      Follow-up Information    Rachel Bo, NP. Schedule an appointment as soon as possible for a visit in 2 week(s).   Specialty:  Nurse Practitioner Contact information: [address] [place] [address] [phone]            Rachel Bo, NP-C 07/02/2016, 10:04 AM  Pager: [phone] Office: [phone]

## 2016-07-04 ENCOUNTER — Encounter (HOSPITAL_COMMUNITY): Payer: Self-pay | Admitting: Cardiology

## 2016-07-13 DIAGNOSIS — I10 Essential (primary) hypertension: Secondary | ICD-10-CM | POA: Diagnosis not present

## 2016-07-13 DIAGNOSIS — I2510 Atherosclerotic heart disease of native coronary artery without angina pectoris: Secondary | ICD-10-CM | POA: Diagnosis not present

## 2016-07-13 DIAGNOSIS — I252 Old myocardial infarction: Secondary | ICD-10-CM | POA: Diagnosis not present

## 2016-07-13 DIAGNOSIS — I441 Atrioventricular block, second degree: Secondary | ICD-10-CM | POA: Diagnosis not present

## 2016-10-17 DIAGNOSIS — I10 Essential (primary) hypertension: Secondary | ICD-10-CM | POA: Diagnosis not present

## 2016-10-17 DIAGNOSIS — Z79899 Other long term (current) drug therapy: Secondary | ICD-10-CM | POA: Diagnosis not present

## 2016-10-17 DIAGNOSIS — I7091 Generalized atherosclerosis: Secondary | ICD-10-CM | POA: Diagnosis not present

## 2016-12-28 ENCOUNTER — Ambulatory Visit (INDEPENDENT_AMBULATORY_CARE_PROVIDER_SITE_OTHER): Payer: PPO

## 2016-12-28 ENCOUNTER — Encounter: Payer: Self-pay | Admitting: Podiatry

## 2016-12-28 ENCOUNTER — Ambulatory Visit (INDEPENDENT_AMBULATORY_CARE_PROVIDER_SITE_OTHER): Payer: PPO | Admitting: Podiatry

## 2016-12-28 DIAGNOSIS — M722 Plantar fascial fibromatosis: Secondary | ICD-10-CM

## 2016-12-28 NOTE — Progress Notes (Signed)
   Subjective:    Patient ID: [name], male    DOB: 12-03-1950, 66 y.o.   MRN: [id number]  HPI: He presents today with a chief complaint of pain to the left heel he states it comes and goes over the past month or so. He states that his throbbing and sharp shooting sometimes seems to be coming from the Achilles and underneath the heel into the plantar fascia area. States that he's tried gel inserts to no relief. He's tried I's and Aleve with some relief.    Review of Systems  All other systems reviewed and are negative.      Objective:   Physical Exam vital signs are stable alert and oriented 3. Pulses are palpable. Neurologic sensorium is intact. Deep tendon reflexes are intact. Muscle strength +5 over 5 dorsiflexion plantar flexors and inverters everters all internal musculatures intact. Orthopedic evaluation of a straight solid joints distal to the ankle for range of motion without crepitation. Is pain on palpation medial trochanter tubercle of the left heel. Radiographs taken today do not demonstrate any type of major osseous abnormalities though it does demonstrate some soft tissue increase in density at the plantar fascia calcaneal insertion site consistent with plantar fasciitis. Cutaneous evaluation does not demonstrate any open wounds or lesions.        Assessment & Plan:  Assessment: Plantar fasciitis left. Mild Achilles tendinitis distally.  Plan: I injected his left heel today with Kenalog and local anesthetic discussed appropriate shoe gear stretching exercises ice therapy and shoe gear modifications. I will follow-up with him in 1 month if necessary.

## 2017-05-09 DIAGNOSIS — Z1389 Encounter for screening for other disorder: Secondary | ICD-10-CM | POA: Diagnosis not present

## 2017-05-09 DIAGNOSIS — I10 Essential (primary) hypertension: Secondary | ICD-10-CM | POA: Diagnosis not present

## 2017-05-09 DIAGNOSIS — E785 Hyperlipidemia, unspecified: Secondary | ICD-10-CM | POA: Diagnosis not present

## 2017-05-09 DIAGNOSIS — Z125 Encounter for screening for malignant neoplasm of prostate: Secondary | ICD-10-CM | POA: Diagnosis not present

## 2017-05-09 DIAGNOSIS — Z0000 Encounter for general adult medical examination without abnormal findings: Secondary | ICD-10-CM | POA: Diagnosis not present

## 2017-05-09 DIAGNOSIS — G43909 Migraine, unspecified, not intractable, without status migrainosus: Secondary | ICD-10-CM | POA: Diagnosis not present

## 2017-05-09 DIAGNOSIS — K900 Celiac disease: Secondary | ICD-10-CM | POA: Diagnosis not present

## 2017-05-09 DIAGNOSIS — K219 Gastro-esophageal reflux disease without esophagitis: Secondary | ICD-10-CM | POA: Diagnosis not present

## 2017-05-09 DIAGNOSIS — I208 Other forms of angina pectoris: Secondary | ICD-10-CM | POA: Diagnosis not present

## 2017-11-14 DIAGNOSIS — K219 Gastro-esophageal reflux disease without esophagitis: Secondary | ICD-10-CM | POA: Diagnosis not present

## 2017-11-14 DIAGNOSIS — I10 Essential (primary) hypertension: Secondary | ICD-10-CM | POA: Diagnosis not present

## 2017-11-14 DIAGNOSIS — K900 Celiac disease: Secondary | ICD-10-CM | POA: Diagnosis not present

## 2017-11-14 DIAGNOSIS — E785 Hyperlipidemia, unspecified: Secondary | ICD-10-CM | POA: Diagnosis not present

## 2017-11-14 DIAGNOSIS — G43909 Migraine, unspecified, not intractable, without status migrainosus: Secondary | ICD-10-CM | POA: Diagnosis not present

## 2017-11-14 DIAGNOSIS — I208 Other forms of angina pectoris: Secondary | ICD-10-CM | POA: Diagnosis not present

## 2018-01-02 ENCOUNTER — Encounter (HOSPITAL_COMMUNITY): Payer: Self-pay | Admitting: Emergency Medicine

## 2018-01-02 ENCOUNTER — Encounter (HOSPITAL_COMMUNITY)
Admission: EM | Disposition: A | Discharge status: Home / Self Care | Payer: Self-pay | Source: Home / Self Care | Attending: Physician Assistant

## 2018-01-02 ENCOUNTER — Other Ambulatory Visit: Payer: Self-pay

## 2018-01-02 ENCOUNTER — Observation Stay (HOSPITAL_COMMUNITY)
Admission: EM | Admit: 2018-01-02 | Discharge: 2018-01-02 | Disposition: A | Discharge status: Home / Self Care | Payer: PPO | Attending: Cardiology | Admitting: Cardiology

## 2018-01-02 ENCOUNTER — Emergency Department (HOSPITAL_COMMUNITY): Payer: PPO

## 2018-01-02 DIAGNOSIS — I2510 Atherosclerotic heart disease of native coronary artery without angina pectoris: Secondary | ICD-10-CM

## 2018-01-02 DIAGNOSIS — E785 Hyperlipidemia, unspecified: Secondary | ICD-10-CM | POA: Insufficient documentation

## 2018-01-02 DIAGNOSIS — K900 Celiac disease: Secondary | ICD-10-CM | POA: Diagnosis not present

## 2018-01-02 DIAGNOSIS — I25110 Atherosclerotic heart disease of native coronary artery with unstable angina pectoris: Principal | ICD-10-CM | POA: Insufficient documentation

## 2018-01-02 DIAGNOSIS — K219 Gastro-esophageal reflux disease without esophagitis: Secondary | ICD-10-CM

## 2018-01-02 DIAGNOSIS — I252 Old myocardial infarction: Secondary | ICD-10-CM | POA: Diagnosis not present

## 2018-01-02 DIAGNOSIS — R079 Chest pain, unspecified: Secondary | ICD-10-CM | POA: Diagnosis not present

## 2018-01-02 DIAGNOSIS — R0789 Other chest pain: Secondary | ICD-10-CM | POA: Diagnosis not present

## 2018-01-02 DIAGNOSIS — Z7982 Long term (current) use of aspirin: Secondary | ICD-10-CM | POA: Insufficient documentation

## 2018-01-02 DIAGNOSIS — I200 Unstable angina: Secondary | ICD-10-CM | POA: Diagnosis not present

## 2018-01-02 DIAGNOSIS — I441 Atrioventricular block, second degree: Secondary | ICD-10-CM | POA: Diagnosis not present

## 2018-01-02 DIAGNOSIS — I10 Essential (primary) hypertension: Secondary | ICD-10-CM | POA: Diagnosis not present

## 2018-01-02 HISTORY — PX: LEFT HEART CATH AND CORONARY ANGIOGRAPHY: CATH118249

## 2018-01-02 LAB — BASIC METABOLIC PANEL
Anion gap: 11 (ref 5–15)
BUN: 13 mg/dL (ref 6–20)
CALCIUM: 9.4 mg/dL (ref 8.9–10.3)
CO2: 24 mmol/L (ref 22–32)
CREATININE: 1.08 mg/dL (ref 0.61–1.24)
Chloride: 103 mmol/L (ref 101–111)
GFR calc non Af Amer: >60 mL/min (ref >60)
Glucose, Bld: 108 mg/dL — ABNORMAL HIGH (ref 65–99)
Potassium: 3.9 mmol/L (ref 3.5–5.1)
SODIUM: 138 mmol/L (ref 135–145)

## 2018-01-02 LAB — I-STAT TROPONIN, ED: TROPONIN I, POC: 0 ng/mL (ref 0.00–0.08)

## 2018-01-02 LAB — CBC
HCT: 44.1 % (ref 39.0–52.0)
Hemoglobin: 15.4 g/dL (ref 13.0–17.0)
MCH: 33.3 pg (ref 26.0–34.0)
MCHC: 34.9 g/dL (ref 30.0–36.0)
MCV: 95.5 fL (ref 78.0–100.0)
PLATELETS: 204 10*3/uL (ref 150–400)
RBC: 4.62 MIL/uL (ref 4.22–5.81)
RDW: 12.3 % (ref 11.5–15.5)
WBC: 8.5 10*3/uL (ref 4.0–10.5)

## 2018-01-02 SURGERY — LEFT HEART CATH AND CORONARY ANGIOGRAPHY
Anesthesia: LOCAL

## 2018-01-02 MED ORDER — HEPARIN SODIUM (PORCINE) 1000 UNIT/ML IJ SOLN
INTRAMUSCULAR | Status: AC
  Filled 2018-01-02: qty 1

## 2018-01-02 MED ORDER — VERAPAMIL HCL 2.5 MG/ML IV SOLN
INTRAVENOUS | Status: DC | PRN
  Administered 2018-01-02: 14:00:00 via INTRA_ARTERIAL

## 2018-01-02 MED ORDER — IOPAMIDOL (ISOVUE-370) INJECTION 76%
INTRAVENOUS | Status: AC
  Filled 2018-01-02: qty 100

## 2018-01-02 MED ORDER — FENTANYL CITRATE (PF) 100 MCG/2ML IJ SOLN
INTRAMUSCULAR | Status: AC
  Filled 2018-01-02: qty 2

## 2018-01-02 MED ORDER — ACETAMINOPHEN 325 MG PO TABS: 650.0000 mg | ORAL_TABLET | ORAL | Status: DC | PRN

## 2018-01-02 MED ORDER — VERAPAMIL HCL 2.5 MG/ML IV SOLN
INTRAVENOUS | Status: AC
  Filled 2018-01-02: qty 2

## 2018-01-02 MED ORDER — ASPIRIN 300 MG RE SUPP: 300.0000 mg | RECTAL | Status: DC

## 2018-01-02 MED ORDER — ONDANSETRON HCL 4 MG/2ML IJ SOLN: 4.0000 mg | Freq: Four times a day (QID) | INTRAMUSCULAR | Status: DC | PRN

## 2018-01-02 MED ORDER — HEPARIN (PORCINE) IN NACL 2-0.9 UNIT/ML-% IJ SOLN
INTRAMUSCULAR | Status: AC
  Filled 2018-01-02: qty 1000

## 2018-01-02 MED ORDER — LISINOPRIL 10 MG PO TABS: 10.0000 mg | ORAL_TABLET | Freq: Every day | ORAL | 3 refills | Status: AC

## 2018-01-02 MED ORDER — LIDOCAINE HCL (PF) 1 % IJ SOLN
INTRAMUSCULAR | Status: DC | PRN
  Administered 2018-01-02: 2 mL

## 2018-01-02 MED ORDER — SODIUM CHLORIDE 0.9 % IV SOLN: 250.0000 mL | INTRAVENOUS | Status: DC | PRN

## 2018-01-02 MED ORDER — NITROGLYCERIN 0.4 MG SL SUBL: 0.4000 mg | SUBLINGUAL_TABLET | SUBLINGUAL | Status: DC | PRN

## 2018-01-02 MED ORDER — HEPARIN SODIUM (PORCINE) 1000 UNIT/ML IJ SOLN
INTRAMUSCULAR | Status: DC | PRN
  Administered 2018-01-02: 4500 [IU] via INTRAVENOUS

## 2018-01-02 MED ORDER — MIDAZOLAM HCL 2 MG/2ML IJ SOLN
INTRAMUSCULAR | Status: DC | PRN
  Administered 2018-01-02: 1 mg via INTRAVENOUS

## 2018-01-02 MED ORDER — SODIUM CHLORIDE 0.9 % IV SOLN: INTRAVENOUS | Status: DC

## 2018-01-02 MED ORDER — SODIUM CHLORIDE 0.9% FLUSH: 3.0000 mL | Freq: Two times a day (BID) | INTRAVENOUS | Status: DC

## 2018-01-02 MED ORDER — IOPAMIDOL (ISOVUE-370) INJECTION 76%
INTRAVENOUS | Status: DC | PRN
  Administered 2018-01-02: 120 mL via INTRA_ARTERIAL

## 2018-01-02 MED ORDER — ASPIRIN 81 MG PO CHEW: 324.0000 mg | CHEWABLE_TABLET | ORAL | Status: DC

## 2018-01-02 MED ORDER — OMEPRAZOLE 40 MG PO CPDR: 40.0000 mg | DELAYED_RELEASE_CAPSULE | ORAL | 3 refills | Status: AC

## 2018-01-02 MED ORDER — FENTANYL CITRATE (PF) 100 MCG/2ML IJ SOLN
INTRAMUSCULAR | Status: DC | PRN
  Administered 2018-01-02: 25 ug via INTRAVENOUS

## 2018-01-02 MED ORDER — MIDAZOLAM HCL 2 MG/2ML IJ SOLN
INTRAMUSCULAR | Status: AC
  Filled 2018-01-02: qty 2

## 2018-01-02 MED ORDER — LIDOCAINE HCL (PF) 1 % IJ SOLN
INTRAMUSCULAR | Status: AC
  Filled 2018-01-02: qty 30

## 2018-01-02 MED ORDER — SODIUM CHLORIDE 0.9% FLUSH: 3.0000 mL | INTRAVENOUS | Status: DC | PRN

## 2018-01-02 MED ORDER — HEPARIN (PORCINE) IN NACL 2-0.9 UNIT/ML-% IJ SOLN
INTRAMUSCULAR | Status: AC | PRN
  Administered 2018-01-02: 500 mL via INTRA_ARTERIAL
  Administered 2018-01-02: 500 mL

## 2018-01-02 MED ORDER — ASPIRIN EC 81 MG PO TBEC: 81.0000 mg | DELAYED_RELEASE_TABLET | Freq: Every day | ORAL | Status: DC

## 2018-01-02 SURGICAL SUPPLY — 12 items
CATH 5FR JL3.5 JR4 ANG PIG MP (CATHETERS) ×2 IMPLANT
CATH INFINITI 5 FR 3DRC (CATHETERS) ×2 IMPLANT
CATH INFINITI 5 FR AR1 MOD (CATHETERS) ×2 IMPLANT
DEVICE RAD COMP TR BAND LRG (VASCULAR PRODUCTS) ×2 IMPLANT
GLIDESHEATH SLEND A-KIT 6F 20G (SHEATH) ×2 IMPLANT
GUIDEWIRE INQWIRE 1.5J.035X260 (WIRE) ×1 IMPLANT
INQWIRE 1.5J .035X260CM (WIRE) ×2
KIT HEART LEFT (KITS) ×2 IMPLANT
PACK CARDIAC CATHETERIZATION (CUSTOM PROCEDURE TRAY) ×2 IMPLANT
TRANSDUCER W/STOPCOCK (MISCELLANEOUS) ×2 IMPLANT
TUBING CIL FLEX 10 FLL-RA (TUBING) ×2 IMPLANT
WIRE EMERALD 3MM-J .035X150CM (WIRE) ×2 IMPLANT

## 2018-01-02 NOTE — ED Triage Notes (Signed)
Patient reports intermittant chest pain started weeks ago that "flairs". It has been going on about 3 days this time. The chest pain is centralized. He does workout daily on treadmill. Has hx of unstable angina and took aspirin and nitro and is feeling better. No other presenting symptoms.

## 2018-01-02 NOTE — ED Provider Notes (Signed)
[facility] EMERGENCY DEPARTMENT Provider Note   CSN: [id number] Arrival date & time: 01/02/18  1139     History   Chief Complaint Chief Complaint  Patient presents with  . Chest Pain    HPI Marx Doig is a 67 y.o. male.  HPI   66 year old male presenting with chest pain.  Patient had heaviness in his chest.  Patient usually runs for 30 minutes on an elliptical and has not had any chest pain But  today developed some chest pain similar to 2017.  It was nitro responsive.  Patient went to his cardiologist.  Sent here for further evaluation.  Seen by cardiology in the ED and thought to be taken to Cath Lab.  Past Medical History:  Diagnosis Date  . Celiac disease   . Hypertension     Patient Active Problem List   Diagnosis Date Noted  . Unstable angina ([place]) 07/01/2016  . Unstable angina pectoris ([place]) 07/01/2016    Past Surgical History:  Procedure Laterality Date  . CARDIAC CATHETERIZATION N/A 07/01/2016   Procedure: Left Heart Cath and Coronary Angiography;  Surgeon: Adrian Prows, MD;  Location: [facility];  Service: Cardiovascular;  Laterality: N/A;       Home Medications    Prior to Admission medications   Medication Sig Start Date End Date Taking? Authorizing Provider  aspirin 81 MG tablet Take 81 mg by mouth daily.    [provider]  atorvastatin (LIPITOR) 80 MG tablet Take 1 tablet (80 mg total) by mouth daily at 6 PM. 07/02/16   Neldon Labella, NP  carvedilol (COREG) 6.25 MG tablet Take 1 tablet (6.25 mg total) by mouth 2 (two) times daily with a meal. 07/02/16   Neldon Labella, NP  Fish Oil-Cholecalciferol (FISH OIL + D3 PO) Take 1 capsule by mouth at bedtime.     [provider]  lisinopril (PRINIVIL,ZESTRIL) 5 MG tablet  10/17/16   [provider]  nitroGLYCERIN (NITROSTAT) 0.4 MG SL tablet Place 1 tablet (0.4 mg total) under the tongue every 5 (five) minutes as needed for chest pain. 07/02/16    Neldon Labella, NP  omeprazole (PRILOSEC) 20 MG capsule Take 20 mg by mouth every other day.    [provider]  SUMAtriptan (IMITREX) 100 MG tablet Take 100 mg by mouth every 2 (two) hours as needed for migraine or headache. May repeat in 2 hours if headache persists or recurs.    [provider]    Family History History reviewed. No pertinent family history.  Social History Social History   Tobacco Use  . Smoking status: Never Smoker  . Smokeless tobacco: Never Used  Substance Use Topics  . Alcohol use: Yes  . Drug use: No     Allergies   Advil [ibuprofen]   Review of Systems Review of Systems  Constitutional: Negative for activity change, fatigue and fever.  Respiratory: Positive for chest tightness. Negative for shortness of breath.   Cardiovascular: Positive for chest pain.  Gastrointestinal: Negative for abdominal pain.     Physical Exam Updated Vital Signs BP (!) 154/90   Pulse 75   Temp 98.1 F (36.7 C) (Oral)   Resp (!) 22   SpO2 97%   Physical Exam  Constitutional: He is oriented to person, place, and time. He appears well-nourished.  HENT:  Head: Normocephalic.  Eyes: Conjunctivae and EOM are normal. Pupils are equal, round, and reactive to light.  Neck: Normal range of motion.  Cardiovascular:  Normal rate, regular rhythm and normal pulses.  Pulmonary/Chest: Effort normal and breath sounds normal. He has no decreased breath sounds.  Neurological: He is oriented to person, place, and time.  Skin: Skin is warm and dry. He is not diaphoretic.  Psychiatric: He has a normal mood and affect. His behavior is normal.     ED Treatments / Results  Labs (all labs ordered are listed, but only abnormal results are displayed) Labs Reviewed  BASIC METABOLIC PANEL - Abnormal; Notable for the following components:      Result Value   Glucose, Bld 108 (*)    All other components within normal limits  CBC  TROPONIN I  TROPONIN I    TROPONIN I  LIPID PANEL  I-STAT TROPONIN, ED    EKG  EKG Interpretation  Date/Time:  Tuesday January 02 2018 11:45:04 EST Ventricular Rate:  69 PR Interval:  234 QRS Duration: 78 QT Interval:  396 QTC Calculation: 424 R Axis:   76 Text Interpretation:  Sinus rhythm with 1st degree A-V block Possible Anterior infarct , age undetermined Abnormal ECG No significant change since last tracing Confirmed by Zenovia Jarred [id number]) on 01/02/2018 1:49:05 PM       Radiology Dg Chest 2 View  Result Date: 01/02/2018 CLINICAL DATA:  Chest pain. EXAM: CHEST  2 VIEW COMPARISON:  07/01/2016. FINDINGS: Mediastinum hilar structures normal. Heart size normal. No focal infiltrate. No pleural effusion or pneumothorax. Degenerative change thoracic spine. Biapical pleural thickening consistent with scarring. Diffuse degenerative change. Mild compression fracture upper lumbar spine, no change. IMPRESSION: No acute cardiopulmonary disease. Electronically Signed   By: Marcello Moores  Register   On: 01/02/2018 12:26    Procedures Procedures (including critical care time)  Medications Ordered in ED Medications  aspirin chewable tablet 324 mg (not administered)    Or  aspirin suppository 300 mg (not administered)  aspirin EC tablet 81 mg (not administered)  nitroGLYCERIN (NITROSTAT) SL tablet 0.4 mg (not administered)  acetaminophen (TYLENOL) tablet 650 mg (not administered)  ondansetron (ZOFRAN) injection 4 mg (not administered)     Initial Impression / Assessment and Plan / ED Course  I have reviewed the triage vital signs and the nursing notes.  Pertinent labs & imaging results that were available during my care of the patient were reviewed by me and considered in my medical decision making (see chart for details).     67 year old male presenting with chest pain.  Patient had heaviness in his chest.  Patient usually runs for 30 minutes on an elliptical and has not had any chest pain But  today  developed some chest pain similar to 2017.  It was nitro responsive.  Patient went to his cardiologist.  Sent here for further evaluation.  Seen by cardiology in the ED on arrival and will be taken to Cath Lab.  1:49 PM Very well appearing. Discussed with cards, will go to cath.   Final Clinical Impressions(s) / ED Diagnoses   Final diagnoses:  None    ED Discharge Orders    None       Icarus Partch, Fredia Sorrow, MD 01/02/18 1349

## 2018-01-02 NOTE — Interval H&P Note (Signed)
History and Physical Interval Note:  01/02/2018 2:07 PM  [name]  has presented today for surgery, with the diagnosis of cp  The various methods of treatment have been discussed with the patient and family. After consideration of risks, benefits and other options for treatment, the patient has consented to  Procedure(s): LEFT HEART CATH AND CORONARY ANGIOGRAPHY (N/A) as a surgical intervention .  The patient's history has been reviewed, patient examined, no change in status, stable for surgery.  I have reviewed the patient's chart and labs.  Questions were answered to the patient's satisfaction.     Cath Lab Visit (complete for each Cath Lab visit)  Clinical Evaluation Leading to the Procedure:   ACS: Yes.  Unstable angina  Non-ACS:    Anginal Classification: CCS IV  Anti-ischemic medical therapy: Minimal Therapy (1 class of medications)  Non-Invasive Test Results: No non-invasive testing performed  Prior CABG: No previous CABG    Manish J Patwardhan

## 2018-01-02 NOTE — Discharge Summary (Addendum)
Physician Discharge Summary  Patient ID: [name] MRN: [id number] DOB/AGE: [birth year] 67 y.o.  Admit date: 01/07/18 Discharge date: 2018-01-07  Admission Diagnoses: Unstable angina  Discharge Diagnoses:  Active Problems:   Unstable angina (HCC)   Nonobstructive atherosclerosis of coronary artery   GERD (gastroesophageal reflux disease)   Discharged Condition: stable  Hospital Course:   67 year old Caucasian male with hypertension, no nonobstructive coronary artery disease, hyperlipidemia, celiac disease. Admitted with chest pain.  Chest pain: Possible unstable angina given responsiveness to nitroglycerin. EKG shows no ischemia. Start troponin negative. I discuss options of ruling out MI, followed by stress tests versus proceeding with cardiac catheterization. Discussed risks and benefits for both options. Patient is very anxious and would like to proceed with cardiac catheterization with coronary angina.   Coronary angiogram showed Mild nonobstructive coronary artery disease, unchanged compared to prior coronary angiogram in 06/2016 Normal LVEF. Normal LVEDP. Given stable nonobstructive coronary artery disease, I do not think his symptoms are cardiac in origin. Suspicion for PE/aortic dissection is low. I suspect GERD and possible esophageal spasm as the etiology. Increase omprazole to 40 mg.   While in the hospital, he was incidentally noted to have Mobitz type 1 AV block without symptoms. I have stopped hi coreg 3/125 mg bid, and increased lisinopril from 5 mg to 10 mg daily. Will check BMP next week.   Consults: None  Significant Diagnostic Studies: Coronary angiogram  EKG Jan 07, 2018: Sinus rhythm 70 bpm. First degree AV block. No ischemia/infarct  Treatments:  IV hydration  Discharge Exam: Blood pressure 138/82, pulse 69, temperature 98.1 F (36.7 C), temperature source Oral, resp. rate 15, SpO2 97 %. Nursing note and vitals reviewed. Constitutional: He is  oriented to person, place, and time. He appears well-developed and well-nourished.  HENT:  Head: Normocephalic and atraumatic.  Eyes: Conjunctivae are normal. Pupils are equal, round, and reactive to light.  Neck: Normal range of motion. Neck supple. No JVD present.  Cardiovascular: Normal rate, regular rhythm and normal heart sounds. Right radial site with no bleeding, hematoma. Good capillary refill.  2+ distal pulses. No carotid bruit  No murmur heard. Respiratory: Effort normal and breath sounds normal. He has no wheezes. He has no rales.  GI: Soft. Bowel sounds are normal. There is no tenderness.  Musculoskeletal: He exhibits no edema.  Lymphadenopathy:    He has no cervical adenopathy.  Neurological: He is alert and oriented to person, place, and time. No cranial nerve deficit.  Skin: Skin is warm and dry.  Psychiatric: He has a normal mood and affect.     Disposition: 01-Home or Self Care  Discharge Instructions    Diet - low sodium heart healthy   Complete by:  As directed    Increase activity slowly   Complete by:  As directed      Allergies as of 01-07-18      Reactions   Advil [ibuprofen] Other (See Comments)   Unknown      Medication List    STOP taking these medications   carvedilol 6.25 MG tablet Commonly known as:  COREG     TAKE these medications   aspirin 81 MG tablet Take 81 mg by mouth daily.   atorvastatin 80 MG tablet Commonly known as:  LIPITOR Take 1 tablet (80 mg total) by mouth daily at 6 PM.   FISH OIL + D3 PO Take 1 capsule by mouth at bedtime.   lisinopril 10 MG tablet Commonly known as:  PRINIVIL,ZESTRIL Take 1 tablet (  10 mg total) by mouth daily. What changed:    medication strength  how much to take  how to take this  when to take this   nitroGLYCERIN 0.4 MG SL tablet Commonly known as:  NITROSTAT Place 1 tablet (0.4 mg total) under the tongue every 5 (five) minutes as needed for chest pain.   omeprazole 40 MG  capsule Commonly known as:  PRILOSEC Take 1 capsule (40 mg total) by mouth every other day. What changed:    medication strength  how much to take   SUMAtriptan 100 MG tablet Commonly known as:  IMITREX Take 100 mg by mouth every 2 (two) hours as needed for migraine or headache. May repeat in 2 hours if headache persists or recurs.      Follow-up Information    Nigel Mormon, MD Follow up on 01/11/2018.   Specialty:  Cardiology Why:  10:30 AM Contact information: [address] [place] [address] [phone]           Signed: Nigel Mormon 01/02/2018, 3:17 PM   Nigel Mormon, MD [facility] Pager: [phone] Office: [phone] If no answer Cell [phone]

## 2018-01-02 NOTE — ED Notes (Signed)
Got patient undress into a gown on the monitor patient is resting with family at bedside and call bell in reach

## 2018-01-02 NOTE — Discharge Instructions (Signed)

## 2018-01-02 NOTE — H&P (Signed)
[name] is an 67 y.o. male.   Chief Complaint: Chest pain HPI:   67 year old Caucasian male with history of non-STEMI in 06/2016, Showing nonobstructive coronary artery disease, hypertension, hyperlipidemia, celiac disease. Patient has been having episodes of retrosternal chest pain, with no radiation, no shortness of breath. Episodes have occurred at rest and responded to sublingual nitroglycerin. Patient has been able to walk 15 minutes a mile nontender without any significant symptoms at times. He does have acid reflux but does not take any medications for the same. Pain episodes have no clear correlation with eating meals.  Past Medical History:  Diagnosis Date  . Celiac disease   . Hypertension     Past Surgical History:  Procedure Laterality Date  . CARDIAC CATHETERIZATION N/A 07/01/2016   Procedure: Left Heart Cath and Coronary Angiography;  Surgeon: Adrian Prows, MD;  Location: [facility];  Service: Cardiovascular;  Laterality: N/A;    History reviewed. No pertinent family history. Social History:  reports that  has never smoked. he has never used smokeless tobacco. He reports that he drinks alcohol. He reports that he does not use drugs.  Allergies:  Allergies  Allergen Reactions  . Advil [Ibuprofen] Other (See Comments)    Unknown     (Not in a hospital admission)  Results for orders placed or performed during the hospital encounter of 01/02/18 (from the past 48 hour(s))  Basic metabolic panel     Status: Abnormal   Collection Time: 01/02/18 11:57 AM  Result Value Ref Range   Sodium 138 135 - 145 mmol/L   Potassium 3.9 3.5 - 5.1 mmol/L   Chloride 103 101 - 111 mmol/L   CO2 24 22 - 32 mmol/L   Glucose, Bld 108 (H) 65 - 99 mg/dL   BUN 13 6 - 20 mg/dL   Creatinine, Ser 1.08 0.61 - 1.24 mg/dL   Calcium 9.4 8.9 - 10.3 mg/dL   GFR calc non Af Amer >60 >60 mL/min   GFR calc Af Amer >60 >60 mL/min    Comment: (NOTE) The eGFR has been calculated using the CKD  EPI equation. This calculation has not been validated in all clinical situations. eGFR's persistently <60 mL/min signify possible Chronic Kidney Disease.    Anion gap 11 5 - 15    Comment: Performed at [facility] [address] [place] [address]  CBC     Status: None   Collection Time: 01/02/18 11:57 AM  Result Value Ref Range   WBC 8.5 4.0 - 10.5 K/uL   RBC 4.62 4.22 - 5.81 MIL/uL   Hemoglobin 15.4 13.0 - 17.0 g/dL   HCT 44.1 39.0 - 52.0 %   MCV 95.5 78.0 - 100.0 fL   MCH 33.3 26.0 - 34.0 pg   MCHC 34.9 30.0 - 36.0 g/dL   RDW 12.3 11.5 - 15.5 %   Platelets 204 150 - 400 K/uL    Comment: Performed at [facility], [place] [address] [place], [place] [address]  I-stat troponin, ED     Status: None   Collection Time: 01/02/18 12:28 PM  Result Value Ref Range   Troponin i, poc 0.00 0.00 - 0.08 ng/mL   Comment 3            Comment: Due to the release kinetics of cTnI, a negative result within the first hours of the onset of symptoms does not rule out myocardial infarction with certainty. If myocardial infarction is still suspected,  repeat the test at appropriate intervals.    Dg Chest 2 View  Result Date: 01/02/2018 CLINICAL DATA:  Chest pain. EXAM: CHEST  2 VIEW COMPARISON:  07/01/2016. FINDINGS: Mediastinum hilar structures normal. Heart size normal. No focal infiltrate. No pleural effusion or pneumothorax. Degenerative change thoracic spine. Biapical pleural thickening consistent with scarring. Diffuse degenerative change. Mild compression fracture upper lumbar spine, no change. IMPRESSION: No acute cardiopulmonary disease. Electronically Signed   By: Marcello Moores  Register   On: 01/02/2018 12:26    Review of Systems  Constitutional: Negative for chills, fever and malaise/fatigue.  HENT: Negative.   Eyes: Negative.   Respiratory: Negative for cough, sputum production and shortness of breath.   Cardiovascular: Positive for chest pain. Negative for  palpitations, claudication, leg swelling and PND.  Gastrointestinal: Positive for heartburn.  Genitourinary: Negative.   Musculoskeletal: Negative.   Skin: Negative.  Negative for rash.  Neurological: Negative for dizziness and loss of consciousness.  Endo/Heme/Allergies: Does not bruise/bleed easily.  Psychiatric/Behavioral: The patient is nervous/anxious.   All other systems reviewed and are negative.   Blood pressure 137/83, pulse 71, temperature 98.1 F (36.7 C), temperature source Oral, resp. rate 16, SpO2 100 %. Physical Exam  Nursing note and vitals reviewed. Constitutional: He is oriented to person, place, and time. He appears well-developed and well-nourished.  HENT:  Head: Normocephalic and atraumatic.  Eyes: Conjunctivae are normal. Pupils are equal, round, and reactive to light.  Neck: Normal range of motion. Neck supple. No JVD present.  Cardiovascular: Normal rate, regular rhythm and normal heart sounds.  No murmur heard. Respiratory: Effort normal and breath sounds normal. He has no wheezes. He has no rales.  GI: Soft. Bowel sounds are normal. There is no tenderness.  Musculoskeletal: He exhibits no edema.  Lymphadenopathy:    He has no cervical adenopathy.  Neurological: He is alert and oriented to person, place, and time. No cranial nerve deficit.  Skin: Skin is warm and dry.  Psychiatric: He has a normal mood and affect.     Assessment:  67 year old Caucasian male with hypertension, no nonobstructive coronary artery disease, hyperlipidemia, celiac disease. Admitted with chest pain.  Chest pain: Possible unstable angina given responsiveness to nitroglycerin. EKG shows no ischemia. Start troponin negative. I discuss options of ruling out MI, followed by stress tests versus proceeding with cardiac catheterization. Discussed risks and benefits for both options. Patient is very anxious and would like to proceed with cardiac catheterization with coronary angina.    Plan:  Place in observation. Plan for Cath this afternoon.  If coronary angiograms again shows nonobstructive disease, I strongly suspect his pain could be related to esophageal spasm or acid reflux.  Nigel Mormon, MD 01/02/2018, 1:33 PM  [place], MD [facility] Pager: [phone] Office: [phone] If no answer Cell [phone]

## 2018-01-03 ENCOUNTER — Encounter (HOSPITAL_COMMUNITY): Payer: Self-pay | Admitting: Cardiology

## 2018-01-03 MED FILL — Heparin Sodium (Porcine) 2 Unit/ML in Sodium Chloride 0.9%: INTRAMUSCULAR | Qty: 1000 | Status: AC

## 2018-01-11 DIAGNOSIS — I10 Essential (primary) hypertension: Secondary | ICD-10-CM | POA: Diagnosis not present

## 2018-01-11 DIAGNOSIS — E785 Hyperlipidemia, unspecified: Secondary | ICD-10-CM | POA: Diagnosis not present

## 2018-01-11 DIAGNOSIS — I2510 Atherosclerotic heart disease of native coronary artery without angina pectoris: Secondary | ICD-10-CM | POA: Diagnosis not present

## 2018-01-11 DIAGNOSIS — I441 Atrioventricular block, second degree: Secondary | ICD-10-CM | POA: Diagnosis not present

## 2018-07-02 DIAGNOSIS — K900 Celiac disease: Secondary | ICD-10-CM | POA: Diagnosis not present

## 2018-07-02 DIAGNOSIS — E785 Hyperlipidemia, unspecified: Secondary | ICD-10-CM | POA: Diagnosis not present

## 2018-07-02 DIAGNOSIS — K219 Gastro-esophageal reflux disease without esophagitis: Secondary | ICD-10-CM | POA: Diagnosis not present

## 2018-07-02 DIAGNOSIS — I10 Essential (primary) hypertension: Secondary | ICD-10-CM | POA: Diagnosis not present

## 2018-07-02 DIAGNOSIS — I208 Other forms of angina pectoris: Secondary | ICD-10-CM | POA: Diagnosis not present

## 2018-07-02 DIAGNOSIS — Z125 Encounter for screening for malignant neoplasm of prostate: Secondary | ICD-10-CM | POA: Diagnosis not present

## 2018-07-02 DIAGNOSIS — G43909 Migraine, unspecified, not intractable, without status migrainosus: Secondary | ICD-10-CM | POA: Diagnosis not present

## 2018-07-02 DIAGNOSIS — Z1159 Encounter for screening for other viral diseases: Secondary | ICD-10-CM | POA: Diagnosis not present

## 2018-07-02 DIAGNOSIS — Z0000 Encounter for general adult medical examination without abnormal findings: Secondary | ICD-10-CM | POA: Diagnosis not present

## 2018-07-02 DIAGNOSIS — Z1389 Encounter for screening for other disorder: Secondary | ICD-10-CM | POA: Diagnosis not present

## 2018-07-11 DIAGNOSIS — I2510 Atherosclerotic heart disease of native coronary artery without angina pectoris: Secondary | ICD-10-CM | POA: Diagnosis not present

## 2018-07-11 DIAGNOSIS — I441 Atrioventricular block, second degree: Secondary | ICD-10-CM | POA: Diagnosis not present

## 2018-07-11 DIAGNOSIS — I10 Essential (primary) hypertension: Secondary | ICD-10-CM | POA: Diagnosis not present

## 2018-07-11 DIAGNOSIS — E785 Hyperlipidemia, unspecified: Secondary | ICD-10-CM | POA: Diagnosis not present

## 2018-08-06 DIAGNOSIS — R7309 Other abnormal glucose: Secondary | ICD-10-CM | POA: Diagnosis not present

## 2018-10-23 DIAGNOSIS — R1084 Generalized abdominal pain: Secondary | ICD-10-CM | POA: Diagnosis not present

## 2018-11-26 DIAGNOSIS — E785 Hyperlipidemia, unspecified: Secondary | ICD-10-CM

## 2018-11-26 DIAGNOSIS — K900 Celiac disease: Secondary | ICD-10-CM

## 2018-11-26 DIAGNOSIS — I214 Non-ST elevation (NSTEMI) myocardial infarction: Secondary | ICD-10-CM | POA: Insufficient documentation

## 2018-11-26 DIAGNOSIS — I10 Essential (primary) hypertension: Secondary | ICD-10-CM

## 2018-12-10 ENCOUNTER — Other Ambulatory Visit: Payer: Self-pay | Admitting: Gastroenterology

## 2018-12-10 DIAGNOSIS — R1031 Right lower quadrant pain: Secondary | ICD-10-CM

## 2018-12-10 DIAGNOSIS — R1032 Left lower quadrant pain: Secondary | ICD-10-CM | POA: Diagnosis not present

## 2018-12-10 DIAGNOSIS — K900 Celiac disease: Secondary | ICD-10-CM | POA: Diagnosis not present

## 2018-12-17 ENCOUNTER — Ambulatory Visit
Admission: RE | Admit: 2018-12-17 | Discharge: 2018-12-17 | Disposition: A | Discharge status: Home / Self Care | Payer: PPO | Source: Ambulatory Visit | Attending: Gastroenterology | Admitting: Gastroenterology

## 2018-12-17 DIAGNOSIS — N281 Cyst of kidney, acquired: Secondary | ICD-10-CM | POA: Diagnosis not present

## 2018-12-17 DIAGNOSIS — R1032 Left lower quadrant pain: Secondary | ICD-10-CM

## 2018-12-17 DIAGNOSIS — K5730 Diverticulosis of large intestine without perforation or abscess without bleeding: Secondary | ICD-10-CM | POA: Diagnosis not present

## 2018-12-17 DIAGNOSIS — R1031 Right lower quadrant pain: Secondary | ICD-10-CM

## 2018-12-17 MED ORDER — IOPAMIDOL (ISOVUE-300) INJECTION 61%
100.0000 mL | Freq: Once | INTRAVENOUS | Status: AC | PRN
  Administered 2018-12-17: 100 mL via INTRAVENOUS

## 2019-01-09 ENCOUNTER — Ambulatory Visit: Payer: Self-pay | Admitting: Cardiology

## 2019-01-09 DIAGNOSIS — K219 Gastro-esophageal reflux disease without esophagitis: Secondary | ICD-10-CM | POA: Diagnosis not present

## 2019-01-09 DIAGNOSIS — R1030 Lower abdominal pain, unspecified: Secondary | ICD-10-CM | POA: Diagnosis not present

## 2019-01-09 DIAGNOSIS — K900 Celiac disease: Secondary | ICD-10-CM | POA: Diagnosis not present

## 2019-01-14 NOTE — Progress Notes (Signed)
Patient is here for follow up visit.  Subjective:   @Patient  ID: [name], male    DOB: Jul 05, 1951, 68 y.o.   MRN: [id number]  Chief Complaint  Patient presents with  . Hypertension  . Follow-up    HPI  68 y.o. Caucasian male with with mild nonobstructive coronary artery disease (Cath 12/2017), hypertension, hyperlipidemia, celiac disease, GERD.  Patient is here for 6 month follow up.  Patient is doing very well.  He goes to 3 days a week, regularly walks on treadmill. He denies chest pain, shortness of breath, palpitations, leg edema, orthopnea, PND, TIA/syncope. Blood pressure is elevated today.  Patient states his blood pressure is always normal on home checks.  He has an upcoming visit with his primary care provider Dr. Marisue Humble next week.  Past Medical History:  Diagnosis Date  . Celiac disease   . Hypertension      Past Surgical History:  Procedure Laterality Date  . CARDIAC CATHETERIZATION N/A 07/01/2016   Procedure: Left Heart Cath and Coronary Angiography;  Surgeon: Adrian Prows, MD;  Location: [facility];  Service: Cardiovascular;  Laterality: N/A;  . LEFT HEART CATH AND CORONARY ANGIOGRAPHY N/A 01/02/2018   Procedure: LEFT HEART CATH AND CORONARY ANGIOGRAPHY;  Surgeon: Nigel Mormon, MD;  Location: [facility];  Service: Cardiovascular;  Laterality: N/A;     Social History   Socioeconomic History  . Marital status: Married    Spouse name: Not on file  . Number of children: 0  . Years of education: Not on file  . Highest education level: Not on file  Occupational History  . Not on file  Social Needs  . Financial resource strain: Not on file  . Food insecurity:    Worry: Not on file    Inability: Not on file  . Transportation needs:    Medical: Not on file    Non-medical: Not on file  Tobacco Use  . Smoking status: Never Smoker  . Smokeless tobacco: Never Used  Substance and Sexual Activity  . Alcohol use: Yes    Comment:  occasional  . Drug use: No  . Sexual activity: Not on file  Lifestyle  . Physical activity:    Days per week: Not on file    Minutes per session: Not on file  . Stress: Not on file  Relationships  . Social connections:    Talks on phone: Not on file    Gets together: Not on file    Attends religious service: Not on file    Active member of club or organization: Not on file    Attends meetings of clubs or organizations: Not on file    Relationship status: Not on file  . Intimate partner violence:    Fear of current or ex partner: Not on file    Emotionally abused: Not on file    Physically abused: Not on file    Forced sexual activity: Not on file  Other Topics Concern  . Not on file  Social History Narrative  . Not on file     Current Outpatient Medications on File Prior to Visit  Medication Sig Dispense Refill  . aspirin 81 MG tablet Take 81 mg by mouth. Every other day    . atorvastatin (LIPITOR) 80 MG tablet Take 1 tablet (80 mg total) by mouth daily at 6 PM. (Patient taking differently: Take 20 mg by mouth daily. ) 20 tablet 1  . calcium carbonate (TUMS  CHEWY BITES) 750 MG chewable tablet Chew 1-2 tablets by mouth 3 times/day as needed-between meals & bedtime.     . Fish Oil-Cholecalciferol (FISH OIL + D3 PO) Take 1 capsule by mouth at bedtime.     Marland Kitchen lisinopril (PRINIVIL,ZESTRIL) 10 MG tablet Take 1 tablet (10 mg total) by mouth daily. 30 tablet 3  . nitroGLYCERIN (NITROSTAT) 0.4 MG SL tablet Place 1 tablet (0.4 mg total) under the tongue every 5 (five) minutes as needed for chest pain. 25 tablet 1  . omeprazole (PRILOSEC) 40 MG capsule Take 1 capsule (40 mg total) by mouth every other day. 30 capsule 3  . SUMAtriptan (IMITREX) 100 MG tablet Take 100 mg by mouth every 2 (two) hours as needed for migraine or headache. May repeat in 2 hours if headache persists or recurs.     No current facility-administered medications on file prior to visit.     Cardiovascular  studies:  EKG 01/16/2019: Sinus rhythm 72 bpm First degree AV block Otherwise normal EKG  Cath 01/02/2018: Nonobstructive coronary artery disease, unchanged compared to prior coronary angiogram and 06/2016. Mid LAD 30%, proximal RCA 20%, mid to distal RCA 20% stenoses.  Recent labs:  CBC Latest Ref Rng & Units 01/02/2018  WBC 4.0 - 10.5 K/uL 8.5  Hemoglobin 13.0 - 17.0 g/dL 15.4  Hematocrit 39.0 - 52.0 % 44.1  Platelets 150 - 400 K/uL 204   CMP Latest Ref Rng & Units 01/02/2018  Glucose 65 - 99 mg/dL 108(H)  BUN 6 - 20 mg/dL 13  Creatinine 0.61 - 1.24 mg/dL 1.08  Sodium 135 - 145 mmol/L 138  Potassium 3.5 - 5.1 mmol/L 3.9  Chloride 101 - 111 mmol/L 103  CO2 22 - 32 mmol/L 24  Calcium 8.9 - 10.3 mg/dL 9.4   Lipid Panel 05/09/2017:  Cholesterol 100, HDL 39, triglycerides 55, LDL 50.   Review of Systems  Constitution: Negative for decreased appetite, malaise/fatigue, weight gain and weight loss.  HENT: Negative for congestion.   Eyes: Negative for visual disturbance.  Cardiovascular: Positive for chest pain (occasional discomfort, not associated with exercise). Negative for dyspnea on exertion, leg swelling, palpitations and syncope.  Respiratory: Negative for shortness of breath.   Endocrine: Negative for cold intolerance.  Hematologic/Lymphatic: Does not bruise/bleed easily.  Skin: Negative for itching and rash.  Musculoskeletal: Negative for myalgias.  Gastrointestinal: Negative for abdominal pain, nausea and vomiting.  Genitourinary: Negative for dysuria.  Neurological: Negative for dizziness and weakness.  Psychiatric/Behavioral: The patient is not nervous/anxious.   All other systems reviewed and are negative.      Objective:    Vitals:   01/16/19 0831  BP: (!) 150/84  Pulse: 75  SpO2: 97%     Physical Exam  Constitutional: He appears well-developed and well-nourished. No distress.  HENT:  Head: Atraumatic.  Eyes: Conjunctivae are normal.  Neck: Neck  supple. No JVD present. No thyromegaly present.  Cardiovascular: Normal rate, regular rhythm, normal heart sounds and intact distal pulses. Exam reveals no gallop.  No murmur heard. Pulmonary/Chest: Effort normal and breath sounds normal.  Abdominal: Soft. Bowel sounds are normal.  Musculoskeletal: Normal range of motion.        General: No edema.  Neurological: He is alert.  Skin: Skin is warm and dry.  Psychiatric: He has a normal mood and affect.        Assessment & Recommendations:    68 y.o. Caucasian male with with mild nonobstructive coronary artery disease (Cath 12/2017), hypertension, hyperlipidemia,  celiac disease, GERD.  CAD: Minimal, nonobstructive. Continue Aspirin/statin  Hypertension: Suboptimal today.  States blood pressure is always better at home.  Encouraged him to take his monitor to his next PCP visit later this month.  No change made to his medications today.  Continue regular exercise and heart healthy diet.  I will see him back in 1 year.  Nigel Mormon, MD [facility] Pager: [phone] Office: [phone] If no answer Cell [phone]

## 2019-01-16 ENCOUNTER — Ambulatory Visit (INDEPENDENT_AMBULATORY_CARE_PROVIDER_SITE_OTHER): Payer: PPO | Admitting: Cardiology

## 2019-01-16 ENCOUNTER — Encounter: Payer: Self-pay | Admitting: Cardiology

## 2019-01-16 VITALS — BP 150/84 | HR 75 | Ht 68.0 in | Wt 190.2 lb

## 2019-01-16 DIAGNOSIS — I10 Essential (primary) hypertension: Secondary | ICD-10-CM

## 2019-01-16 DIAGNOSIS — I2510 Atherosclerotic heart disease of native coronary artery without angina pectoris: Secondary | ICD-10-CM

## 2019-01-16 DIAGNOSIS — Z0189 Encounter for other specified special examinations: Secondary | ICD-10-CM | POA: Insufficient documentation

## 2019-01-16 HISTORY — DX: Atherosclerotic heart disease of native coronary artery without angina pectoris: I25.10

## 2019-01-29 DIAGNOSIS — N281 Cyst of kidney, acquired: Secondary | ICD-10-CM | POA: Diagnosis not present

## 2019-01-29 DIAGNOSIS — N411 Chronic prostatitis: Secondary | ICD-10-CM | POA: Diagnosis not present

## 2019-08-26 DIAGNOSIS — K219 Gastro-esophageal reflux disease without esophagitis: Secondary | ICD-10-CM | POA: Diagnosis not present

## 2019-08-26 DIAGNOSIS — E785 Hyperlipidemia, unspecified: Secondary | ICD-10-CM | POA: Diagnosis not present

## 2019-08-26 DIAGNOSIS — K900 Celiac disease: Secondary | ICD-10-CM | POA: Diagnosis not present

## 2019-08-26 DIAGNOSIS — I10 Essential (primary) hypertension: Secondary | ICD-10-CM | POA: Diagnosis not present

## 2019-08-26 DIAGNOSIS — G43909 Migraine, unspecified, not intractable, without status migrainosus: Secondary | ICD-10-CM | POA: Diagnosis not present

## 2019-08-26 DIAGNOSIS — I208 Other forms of angina pectoris: Secondary | ICD-10-CM | POA: Diagnosis not present

## 2019-08-27 DIAGNOSIS — E785 Hyperlipidemia, unspecified: Secondary | ICD-10-CM | POA: Diagnosis not present

## 2019-08-27 DIAGNOSIS — I10 Essential (primary) hypertension: Secondary | ICD-10-CM | POA: Diagnosis not present

## 2019-12-22 ENCOUNTER — Ambulatory Visit: Payer: PPO | Attending: Internal Medicine

## 2019-12-22 DIAGNOSIS — Z23 Encounter for immunization: Secondary | ICD-10-CM | POA: Insufficient documentation

## 2019-12-22 NOTE — Progress Notes (Signed)
   Covid-19 Vaccination Clinic  Name:  [name]    MRN: [id number] DOB: 26-Mar-1951  12/22/2019  Mr. Thorstenson was observed post Covid-19 immunization for 15 minutes without incidence. He was provided with Vaccine Information Sheet and instruction to access the V-Safe system.   Mr. Tebbetts was instructed to call 911 with any severe reactions post vaccine: Marland Kitchen Difficulty breathing  . Swelling of your face and throat  . A fast heartbeat  . A bad rash all over your body  . Dizziness and weakness    Immunizations Administered    Name Date Dose VIS Date Route   Pfizer COVID-19 Vaccine 12/22/2019  2:18 PM 0.3 mL 10/18/2019 Intramuscular   Manufacturer: [place]   Lot: [id number]   [place]: [id number]

## 2020-01-06 ENCOUNTER — Ambulatory Visit: Payer: Self-pay

## 2020-01-14 ENCOUNTER — Ambulatory Visit: Payer: PPO | Attending: Internal Medicine

## 2020-01-14 ENCOUNTER — Ambulatory Visit: Payer: Self-pay

## 2020-01-14 DIAGNOSIS — Z23 Encounter for immunization: Secondary | ICD-10-CM

## 2020-01-14 NOTE — Progress Notes (Signed)
   Covid-19 Vaccination Clinic  Name:  [name]    MRN: [id number] DOB: 05/30/1951  01/14/2020  [name] was observed post Covid-19 immunization for 15 minutes without incident. He was provided with Vaccine Information Sheet and instruction to access the V-Safe system.   [name] was instructed to call 911 with any severe reactions post vaccine: Marland Kitchen Difficulty breathing  . Swelling of face and throat  . A fast heartbeat  . A bad rash all over body  . Dizziness and weakness   Immunizations Administered    Name Date Dose VIS Date Route   Pfizer COVID-19 Vaccine 01/14/2020 11:16 AM 0.3 mL 10/18/2019 Intramuscular   Manufacturer: [place]   Lot: [id number]   [place]: [id number]

## 2020-01-15 ENCOUNTER — Ambulatory Visit: Payer: Self-pay

## 2020-01-22 ENCOUNTER — Ambulatory Visit: Payer: PPO | Admitting: Cardiology

## 2020-01-22 ENCOUNTER — Other Ambulatory Visit: Payer: Self-pay

## 2020-01-22 ENCOUNTER — Encounter: Payer: Self-pay | Admitting: Cardiology

## 2020-01-22 VITALS — BP 143/87 | HR 83 | Temp 97.6°F | Ht 68.0 in | Wt 193.0 lb

## 2020-01-22 DIAGNOSIS — I2510 Atherosclerotic heart disease of native coronary artery without angina pectoris: Secondary | ICD-10-CM

## 2020-01-22 DIAGNOSIS — I10 Essential (primary) hypertension: Secondary | ICD-10-CM | POA: Diagnosis not present

## 2020-01-22 NOTE — Progress Notes (Signed)
Patient is here for follow up visit.  Subjective:   @Patient  ID: [name], male    DOB: 1951/09/23, 69 y.o.   MRN: [id number]  Chief Complaint  Patient presents with  . Hypertension    1 year   . Follow-up    HPI  69 y.o. Caucasian male with with mild nonobstructive coronary artery disease (Cath 12/2017), hypertension, hyperlipidemia, celiac disease, GERD.  Patient admits to not being active recently with regular exercise.  With his level of activity, which includes walking around the house, he denies any chest pain, shortness of breath symptoms.  He is compliant with his medical therapy.  His home blood pressure log shows blood pressures ranging from 110-120/60-80.  I reviewed recent lipid panel with the patient, details below.   Current Outpatient Medications on File Prior to Visit  Medication Sig Dispense Refill  . aspirin 81 MG tablet Take 81 mg by mouth. Every other day    . atorvastatin (LIPITOR) 20 MG tablet Take 20 mg by mouth daily.    . Fish Oil-Cholecalciferol (FISH OIL + D3 PO) Take 1 capsule by mouth at bedtime.     Marland Kitchen lisinopril (PRINIVIL,ZESTRIL) 10 MG tablet Take 1 tablet (10 mg total) by mouth daily. 30 tablet 3  . nitroGLYCERIN (NITROSTAT) 0.4 MG SL tablet Place 1 tablet (0.4 mg total) under the tongue every 5 (five) minutes as needed for chest pain. 25 tablet 1  . omeprazole (PRILOSEC) 40 MG capsule Take 1 capsule (40 mg total) by mouth every other day. 30 capsule 3  . SUMAtriptan (IMITREX) 100 MG tablet Take 100 mg by mouth every 2 (two) hours as needed for migraine or headache. May repeat in 2 hours if headache persists or recurs.     No current facility-administered medications on file prior to visit.    Cardiovascular studies:  EKG 01/22/2020: Sinus rhythm 79 bpm. First degree A-V block   Cath 01/02/2018: Nonobstructive coronary artery disease, unchanged compared to prior coronary angiogram and 06/2016. Mid LAD 30%, proximal RCA 20%, mid to  distal RCA 20% stenoses.  Recent labs: 08/27/2019: Glucose 106, BUN/Cr 16/0.9. EGFR 77. Chol 104, TG 48, HDL 44, LDL 51  2019: H/H 15/44. Platelets 204 HbA1C 5.4%   Review of Systems  Constitution: Negative for decreased appetite, malaise/fatigue, weight gain and weight loss.  HENT: Negative for congestion.   Eyes: Negative for visual disturbance.  Cardiovascular: Positive for chest pain (occasional discomfort, not associated with exercise). Negative for dyspnea on exertion, leg swelling, palpitations and syncope.  Respiratory: Negative for shortness of breath.   Endocrine: Negative for cold intolerance.  Hematologic/Lymphatic: Does not bruise/bleed easily.  Skin: Negative for itching and rash.  Musculoskeletal: Negative for myalgias.  Gastrointestinal: Negative for abdominal pain, nausea and vomiting.  Genitourinary: Negative for dysuria.  Neurological: Negative for dizziness and weakness.  Psychiatric/Behavioral: The patient is not nervous/anxious.   All other systems reviewed and are negative.      Objective:    Vitals:   01/22/20 1029 01/22/20 1039  BP: (!) 152/91 (!) 143/87  Pulse: 87 83  Temp: 97.6 F (36.4 C)   SpO2: 97%      Physical Exam  Constitutional: He appears well-developed and well-nourished.  Neck: No JVD present.  Cardiovascular: Normal rate, regular rhythm and normal heart sounds.  No murmur heard. Pulses:      Dorsalis pedis pulses are 1+ on the right side and 1+ on the left side.  Posterior tibial pulses are 0 on the right side and 1+ on the left side.  Bilateral lower extremity varicose veins  Pulmonary/Chest: Effort normal and breath sounds normal. He has no wheezes. He has no rales.  Musculoskeletal:        General: No edema.  Nursing note and vitals reviewed.       Assessment & Recommendations:    69 y.o. Caucasian male with with mild nonobstructive coronary artery disease (Cath 12/2017), hypertension, hyperlipidemia, celiac  disease, GERD.  CAD: Minimal, nonobstructive (Coronary angiography 2019).  Continue Aspirin/statin.  Favorable lipid panel on current dose of Lipitor 80 mg daily. Abnormal vascular exam with diminished distal pulses, without any critical limb ischemia or symptoms of claudication.  Continue medical management and regular walking.  Hypertension: Suspect whitecoat syndrome given controlled blood pressure at home, with elevated readings in the office.  Continue lisinopril 10 mg daily.  He has upcoming follow-up with PCP in June.    Follow-up in 1 year.  Nigel Mormon, MD [facility] Pager: [phone] Office: [phone] If no answer Cell [phone]

## 2020-02-25 DIAGNOSIS — G43909 Migraine, unspecified, not intractable, without status migrainosus: Secondary | ICD-10-CM | POA: Diagnosis not present

## 2020-02-25 DIAGNOSIS — K900 Celiac disease: Secondary | ICD-10-CM | POA: Diagnosis not present

## 2020-02-25 DIAGNOSIS — Z125 Encounter for screening for malignant neoplasm of prostate: Secondary | ICD-10-CM | POA: Diagnosis not present

## 2020-02-25 DIAGNOSIS — Z1389 Encounter for screening for other disorder: Secondary | ICD-10-CM | POA: Diagnosis not present

## 2020-02-25 DIAGNOSIS — I208 Other forms of angina pectoris: Secondary | ICD-10-CM | POA: Diagnosis not present

## 2020-02-25 DIAGNOSIS — K219 Gastro-esophageal reflux disease without esophagitis: Secondary | ICD-10-CM | POA: Diagnosis not present

## 2020-02-25 DIAGNOSIS — I10 Essential (primary) hypertension: Secondary | ICD-10-CM | POA: Diagnosis not present

## 2020-02-25 DIAGNOSIS — Z0000 Encounter for general adult medical examination without abnormal findings: Secondary | ICD-10-CM | POA: Diagnosis not present

## 2020-02-25 DIAGNOSIS — E785 Hyperlipidemia, unspecified: Secondary | ICD-10-CM | POA: Diagnosis not present

## 2020-08-12 DIAGNOSIS — H52203 Unspecified astigmatism, bilateral: Secondary | ICD-10-CM | POA: Diagnosis not present

## 2020-08-12 DIAGNOSIS — H2513 Age-related nuclear cataract, bilateral: Secondary | ICD-10-CM | POA: Diagnosis not present

## 2020-09-02 DIAGNOSIS — I25119 Atherosclerotic heart disease of native coronary artery with unspecified angina pectoris: Secondary | ICD-10-CM | POA: Diagnosis not present

## 2020-09-02 DIAGNOSIS — E785 Hyperlipidemia, unspecified: Secondary | ICD-10-CM | POA: Diagnosis not present

## 2020-09-02 DIAGNOSIS — I10 Essential (primary) hypertension: Secondary | ICD-10-CM | POA: Diagnosis not present

## 2020-09-02 DIAGNOSIS — I208 Other forms of angina pectoris: Secondary | ICD-10-CM | POA: Diagnosis not present

## 2020-09-02 DIAGNOSIS — K900 Celiac disease: Secondary | ICD-10-CM | POA: Diagnosis not present

## 2020-09-02 DIAGNOSIS — K219 Gastro-esophageal reflux disease without esophagitis: Secondary | ICD-10-CM | POA: Diagnosis not present

## 2020-09-02 DIAGNOSIS — G43909 Migraine, unspecified, not intractable, without status migrainosus: Secondary | ICD-10-CM | POA: Diagnosis not present

## 2021-01-21 ENCOUNTER — Ambulatory Visit: Payer: PPO | Admitting: Cardiology

## 2021-03-15 DIAGNOSIS — Z1389 Encounter for screening for other disorder: Secondary | ICD-10-CM | POA: Diagnosis not present

## 2021-03-15 DIAGNOSIS — I10 Essential (primary) hypertension: Secondary | ICD-10-CM | POA: Diagnosis not present

## 2021-03-15 DIAGNOSIS — E785 Hyperlipidemia, unspecified: Secondary | ICD-10-CM | POA: Diagnosis not present

## 2021-03-15 DIAGNOSIS — D692 Other nonthrombocytopenic purpura: Secondary | ICD-10-CM | POA: Diagnosis not present

## 2021-03-15 DIAGNOSIS — K900 Celiac disease: Secondary | ICD-10-CM | POA: Diagnosis not present

## 2021-03-15 DIAGNOSIS — I208 Other forms of angina pectoris: Secondary | ICD-10-CM | POA: Diagnosis not present

## 2021-03-15 DIAGNOSIS — K219 Gastro-esophageal reflux disease without esophagitis: Secondary | ICD-10-CM | POA: Diagnosis not present

## 2021-03-15 DIAGNOSIS — G43909 Migraine, unspecified, not intractable, without status migrainosus: Secondary | ICD-10-CM | POA: Diagnosis not present

## 2021-03-15 DIAGNOSIS — Z0000 Encounter for general adult medical examination without abnormal findings: Secondary | ICD-10-CM | POA: Diagnosis not present

## 2021-03-15 DIAGNOSIS — I25119 Atherosclerotic heart disease of native coronary artery with unspecified angina pectoris: Secondary | ICD-10-CM | POA: Diagnosis not present

## 2021-09-15 DIAGNOSIS — I208 Other forms of angina pectoris: Secondary | ICD-10-CM | POA: Diagnosis not present

## 2021-09-15 DIAGNOSIS — D485 Neoplasm of uncertain behavior of skin: Secondary | ICD-10-CM | POA: Diagnosis not present

## 2021-09-15 DIAGNOSIS — R7309 Other abnormal glucose: Secondary | ICD-10-CM | POA: Diagnosis not present

## 2021-09-15 DIAGNOSIS — I25119 Atherosclerotic heart disease of native coronary artery with unspecified angina pectoris: Secondary | ICD-10-CM | POA: Diagnosis not present

## 2021-09-15 DIAGNOSIS — G43909 Migraine, unspecified, not intractable, without status migrainosus: Secondary | ICD-10-CM | POA: Diagnosis not present

## 2021-09-15 DIAGNOSIS — K900 Celiac disease: Secondary | ICD-10-CM | POA: Diagnosis not present

## 2021-09-15 DIAGNOSIS — E785 Hyperlipidemia, unspecified: Secondary | ICD-10-CM | POA: Diagnosis not present

## 2021-09-15 DIAGNOSIS — D692 Other nonthrombocytopenic purpura: Secondary | ICD-10-CM | POA: Diagnosis not present

## 2021-09-15 DIAGNOSIS — K219 Gastro-esophageal reflux disease without esophagitis: Secondary | ICD-10-CM | POA: Diagnosis not present

## 2021-09-15 DIAGNOSIS — I10 Essential (primary) hypertension: Secondary | ICD-10-CM | POA: Diagnosis not present

## 2021-09-22 DIAGNOSIS — L820 Inflamed seborrheic keratosis: Secondary | ICD-10-CM | POA: Diagnosis not present

## 2021-09-22 DIAGNOSIS — D485 Neoplasm of uncertain behavior of skin: Secondary | ICD-10-CM | POA: Diagnosis not present

## 2022-03-23 DIAGNOSIS — R7309 Other abnormal glucose: Secondary | ICD-10-CM | POA: Diagnosis not present

## 2022-03-23 DIAGNOSIS — Z125 Encounter for screening for malignant neoplasm of prostate: Secondary | ICD-10-CM | POA: Diagnosis not present

## 2022-03-23 DIAGNOSIS — I208 Other forms of angina pectoris: Secondary | ICD-10-CM | POA: Diagnosis not present

## 2022-03-23 DIAGNOSIS — I10 Essential (primary) hypertension: Secondary | ICD-10-CM | POA: Diagnosis not present

## 2022-03-23 DIAGNOSIS — E785 Hyperlipidemia, unspecified: Secondary | ICD-10-CM | POA: Diagnosis not present

## 2022-03-23 DIAGNOSIS — I700 Atherosclerosis of aorta: Secondary | ICD-10-CM | POA: Diagnosis not present

## 2022-03-23 DIAGNOSIS — G43909 Migraine, unspecified, not intractable, without status migrainosus: Secondary | ICD-10-CM | POA: Diagnosis not present

## 2022-03-23 DIAGNOSIS — Z0000 Encounter for general adult medical examination without abnormal findings: Secondary | ICD-10-CM | POA: Diagnosis not present

## 2022-03-23 DIAGNOSIS — K900 Celiac disease: Secondary | ICD-10-CM | POA: Diagnosis not present

## 2022-03-23 DIAGNOSIS — K219 Gastro-esophageal reflux disease without esophagitis: Secondary | ICD-10-CM | POA: Diagnosis not present

## 2022-03-23 DIAGNOSIS — Z1331 Encounter for screening for depression: Secondary | ICD-10-CM | POA: Diagnosis not present

## 2022-03-23 DIAGNOSIS — I25119 Atherosclerotic heart disease of native coronary artery with unspecified angina pectoris: Secondary | ICD-10-CM | POA: Diagnosis not present

## 2022-09-21 DIAGNOSIS — I10 Essential (primary) hypertension: Secondary | ICD-10-CM | POA: Diagnosis not present

## 2022-09-21 DIAGNOSIS — K900 Celiac disease: Secondary | ICD-10-CM | POA: Diagnosis not present

## 2022-09-21 DIAGNOSIS — K219 Gastro-esophageal reflux disease without esophagitis: Secondary | ICD-10-CM | POA: Diagnosis not present

## 2022-09-21 DIAGNOSIS — G43909 Migraine, unspecified, not intractable, without status migrainosus: Secondary | ICD-10-CM | POA: Diagnosis not present

## 2022-09-21 DIAGNOSIS — E785 Hyperlipidemia, unspecified: Secondary | ICD-10-CM | POA: Diagnosis not present

## 2022-09-21 DIAGNOSIS — L570 Actinic keratosis: Secondary | ICD-10-CM | POA: Diagnosis not present

## 2022-09-21 DIAGNOSIS — I25119 Atherosclerotic heart disease of native coronary artery with unspecified angina pectoris: Secondary | ICD-10-CM | POA: Diagnosis not present

## 2022-09-21 DIAGNOSIS — I700 Atherosclerosis of aorta: Secondary | ICD-10-CM | POA: Diagnosis not present

## 2022-09-21 DIAGNOSIS — I209 Angina pectoris, unspecified: Secondary | ICD-10-CM | POA: Diagnosis not present

## 2023-03-28 DIAGNOSIS — Z0000 Encounter for general adult medical examination without abnormal findings: Secondary | ICD-10-CM | POA: Diagnosis not present

## 2023-03-28 DIAGNOSIS — I700 Atherosclerosis of aorta: Secondary | ICD-10-CM | POA: Diagnosis not present

## 2023-03-28 DIAGNOSIS — K900 Celiac disease: Secondary | ICD-10-CM | POA: Diagnosis not present

## 2023-03-28 DIAGNOSIS — L570 Actinic keratosis: Secondary | ICD-10-CM | POA: Diagnosis not present

## 2023-03-28 DIAGNOSIS — I10 Essential (primary) hypertension: Secondary | ICD-10-CM | POA: Diagnosis not present

## 2023-03-28 DIAGNOSIS — Z1331 Encounter for screening for depression: Secondary | ICD-10-CM | POA: Diagnosis not present

## 2023-03-28 DIAGNOSIS — I25119 Atherosclerotic heart disease of native coronary artery with unspecified angina pectoris: Secondary | ICD-10-CM | POA: Diagnosis not present

## 2023-03-28 DIAGNOSIS — G43909 Migraine, unspecified, not intractable, without status migrainosus: Secondary | ICD-10-CM | POA: Diagnosis not present

## 2023-03-28 DIAGNOSIS — K219 Gastro-esophageal reflux disease without esophagitis: Secondary | ICD-10-CM | POA: Diagnosis not present

## 2023-03-28 DIAGNOSIS — E785 Hyperlipidemia, unspecified: Secondary | ICD-10-CM | POA: Diagnosis not present

## 2023-07-19 ENCOUNTER — Other Ambulatory Visit: Payer: Self-pay | Admitting: Nurse Practitioner

## 2023-07-19 DIAGNOSIS — R109 Unspecified abdominal pain: Secondary | ICD-10-CM

## 2023-07-19 DIAGNOSIS — K5900 Constipation, unspecified: Secondary | ICD-10-CM | POA: Diagnosis not present

## 2023-07-27 ENCOUNTER — Ambulatory Visit
Admission: RE | Admit: 2023-07-27 | Discharge: 2023-07-27 | Disposition: A | Discharge status: Home / Self Care | Payer: PPO | Source: Ambulatory Visit | Attending: Nurse Practitioner | Admitting: Nurse Practitioner

## 2023-07-27 DIAGNOSIS — R109 Unspecified abdominal pain: Secondary | ICD-10-CM

## 2023-07-27 DIAGNOSIS — R1084 Generalized abdominal pain: Secondary | ICD-10-CM | POA: Diagnosis not present

## 2023-09-28 DIAGNOSIS — I10 Essential (primary) hypertension: Secondary | ICD-10-CM | POA: Diagnosis not present

## 2023-09-28 DIAGNOSIS — K219 Gastro-esophageal reflux disease without esophagitis: Secondary | ICD-10-CM | POA: Diagnosis not present

## 2023-09-28 DIAGNOSIS — I25119 Atherosclerotic heart disease of native coronary artery with unspecified angina pectoris: Secondary | ICD-10-CM | POA: Diagnosis not present

## 2023-09-28 DIAGNOSIS — E782 Mixed hyperlipidemia: Secondary | ICD-10-CM | POA: Diagnosis not present
